# Patient Record
Sex: Male | Born: 1945 | Race: White | Hispanic: No | Marital: Married | Smoking: Former smoker
Health system: Southern US, Community
[De-identification: ages and names within clinical notes are randomized; demographics above are authoritative.]

## PROBLEM LIST (undated history)

## (undated) DIAGNOSIS — J849 Interstitial pulmonary disease, unspecified: Secondary | ICD-10-CM

## (undated) DIAGNOSIS — H269 Unspecified cataract: Secondary | ICD-10-CM

## (undated) DIAGNOSIS — J439 Emphysema, unspecified: Secondary | ICD-10-CM

## (undated) DIAGNOSIS — K219 Gastro-esophageal reflux disease without esophagitis: Secondary | ICD-10-CM

## (undated) DIAGNOSIS — I739 Peripheral vascular disease, unspecified: Secondary | ICD-10-CM

## (undated) DIAGNOSIS — G459 Transient cerebral ischemic attack, unspecified: Secondary | ICD-10-CM

## (undated) DIAGNOSIS — J449 Chronic obstructive pulmonary disease, unspecified: Secondary | ICD-10-CM

## (undated) DIAGNOSIS — M519 Unspecified thoracic, thoracolumbar and lumbosacral intervertebral disc disorder: Secondary | ICD-10-CM

## (undated) DIAGNOSIS — I779 Disorder of arteries and arterioles, unspecified: Secondary | ICD-10-CM

## (undated) DIAGNOSIS — K859 Acute pancreatitis without necrosis or infection, unspecified: Secondary | ICD-10-CM

## (undated) DIAGNOSIS — T7840XA Allergy, unspecified, initial encounter: Secondary | ICD-10-CM

## (undated) DIAGNOSIS — K259 Gastric ulcer, unspecified as acute or chronic, without hemorrhage or perforation: Secondary | ICD-10-CM

## (undated) DIAGNOSIS — G4730 Sleep apnea, unspecified: Secondary | ICD-10-CM

## (undated) DIAGNOSIS — I10 Essential (primary) hypertension: Secondary | ICD-10-CM

## (undated) DIAGNOSIS — M1990 Unspecified osteoarthritis, unspecified site: Secondary | ICD-10-CM

## (undated) HISTORY — DX: Gastric ulcer, unspecified as acute or chronic, without hemorrhage or perforation: K25.9

## (undated) HISTORY — PX: STOMACH SURGERY: SHX791

## (undated) HISTORY — DX: Allergy, unspecified, initial encounter: T78.40XA

## (undated) HISTORY — PX: NECK SURGERY: SHX720

## (undated) HISTORY — DX: Emphysema, unspecified: J43.9

## (undated) HISTORY — DX: Sleep apnea, unspecified: G47.30

## (undated) HISTORY — PX: UPPER GI ENDOSCOPY: SHX6162

## (undated) HISTORY — DX: Unspecified osteoarthritis, unspecified site: M19.90

## (undated) HISTORY — PX: EYE SURGERY: SHX253

## (undated) HISTORY — DX: Chronic obstructive pulmonary disease, unspecified: J44.9

## (undated) HISTORY — DX: Unspecified thoracic, thoracolumbar and lumbosacral intervertebral disc disorder: M51.9

## (undated) HISTORY — DX: Unspecified cataract: H26.9

## (undated) HISTORY — DX: Gastro-esophageal reflux disease without esophagitis: K21.9

## (undated) HISTORY — PX: SPINE SURGERY: SHX786

## (undated) HISTORY — DX: Interstitial pulmonary disease, unspecified: J84.9

## (undated) HISTORY — PX: CHOLECYSTECTOMY: SHX55

---

## 2003-10-20 HISTORY — PX: COLONOSCOPY: SHX174

## 2006-10-19 HISTORY — PX: OTHER SURGICAL HISTORY: SHX169

## 2007-08-09 ENCOUNTER — Ambulatory Visit: Payer: Self-pay | Admitting: Internal Medicine

## 2007-08-11 ENCOUNTER — Emergency Department: Payer: Self-pay | Admitting: Emergency Medicine

## 2007-08-11 ENCOUNTER — Other Ambulatory Visit: Payer: Self-pay

## 2007-12-15 ENCOUNTER — Ambulatory Visit: Payer: Self-pay | Admitting: Internal Medicine

## 2008-09-04 ENCOUNTER — Ambulatory Visit: Payer: Self-pay | Admitting: Internal Medicine

## 2008-09-09 ENCOUNTER — Emergency Department: Payer: Self-pay | Admitting: Emergency Medicine

## 2008-09-19 ENCOUNTER — Ambulatory Visit: Payer: Self-pay

## 2008-09-25 ENCOUNTER — Inpatient Hospital Stay: Payer: Self-pay | Admitting: Vascular Surgery

## 2008-11-24 ENCOUNTER — Emergency Department: Payer: Self-pay | Admitting: Emergency Medicine

## 2009-10-19 DIAGNOSIS — M519 Unspecified thoracic, thoracolumbar and lumbosacral intervertebral disc disorder: Secondary | ICD-10-CM

## 2009-10-19 HISTORY — PX: BACK SURGERY: SHX140

## 2009-10-19 HISTORY — DX: Unspecified thoracic, thoracolumbar and lumbosacral intervertebral disc disorder: M51.9

## 2010-01-02 ENCOUNTER — Ambulatory Visit: Payer: Self-pay

## 2010-01-08 ENCOUNTER — Ambulatory Visit: Payer: Self-pay | Admitting: Unknown Physician Specialty

## 2010-01-09 ENCOUNTER — Ambulatory Visit: Payer: Self-pay | Admitting: Unknown Physician Specialty

## 2011-05-09 ENCOUNTER — Inpatient Hospital Stay: Payer: Self-pay | Admitting: Internal Medicine

## 2011-05-20 ENCOUNTER — Ambulatory Visit: Payer: Self-pay

## 2011-05-21 ENCOUNTER — Ambulatory Visit: Payer: Self-pay | Admitting: Gastroenterology

## 2011-05-25 ENCOUNTER — Telehealth: Payer: Self-pay

## 2011-05-25 DIAGNOSIS — R933 Abnormal findings on diagnostic imaging of other parts of digestive tract: Secondary | ICD-10-CM

## 2011-05-25 NOTE — Telephone Encounter (Signed)
Pt scheduled for EUS need to review meds and instruct pt Left message on machine to call back

## 2011-05-26 ENCOUNTER — Telehealth: Payer: Self-pay | Admitting: *Deleted

## 2011-05-26 NOTE — Telephone Encounter (Signed)
Notified pt we had to r/s his procedure at [facility] from 05/28/11 to 06/11/11 at 1pm. I will have Patty call him with further instructions.

## 2011-05-28 ENCOUNTER — Encounter: Payer: Self-pay | Admitting: Gastroenterology

## 2011-05-28 ENCOUNTER — Other Ambulatory Visit: Payer: Self-pay | Admitting: Gastroenterology

## 2011-05-28 DIAGNOSIS — K859 Acute pancreatitis without necrosis or infection, unspecified: Secondary | ICD-10-CM

## 2011-05-28 NOTE — Telephone Encounter (Addendum)
PATTY, appt has been made. You need to call him for instructions.

## 2011-06-01 NOTE — Telephone Encounter (Signed)
Pt is on Plavix will he need to hold?

## 2011-06-01 NOTE — Telephone Encounter (Signed)
Letter was resent to Dr Dewaine Oats

## 2011-06-01 NOTE — Telephone Encounter (Signed)
Yes, should hold coumadin (please contact his PCP to make sure it it safe for 5 days prior to EUS)

## 2011-06-01 NOTE — Telephone Encounter (Signed)
Letter sent to Dr Mechele Collin regarding plavix

## 2011-06-02 NOTE — Telephone Encounter (Signed)
I spoke with Amy at Dr Audley Hose office and she will have a response faxed to [place]

## 2011-06-03 NOTE — Telephone Encounter (Signed)
Response received ok to hold coumadin 5 days prior pt aware

## 2011-06-11 ENCOUNTER — Encounter: Payer: Self-pay | Admitting: Gastroenterology

## 2011-06-11 ENCOUNTER — Ambulatory Visit: Payer: Self-pay

## 2011-06-26 ENCOUNTER — Encounter: Payer: Self-pay | Admitting: Gastroenterology

## 2011-07-24 ENCOUNTER — Ambulatory Visit: Payer: Self-pay | Admitting: Surgery

## 2011-07-29 ENCOUNTER — Ambulatory Visit: Payer: Self-pay | Admitting: Surgery

## 2011-08-03 ENCOUNTER — Inpatient Hospital Stay: Payer: Self-pay | Admitting: Surgery

## 2012-06-29 ENCOUNTER — Ambulatory Visit: Payer: Self-pay | Admitting: Gastroenterology

## 2012-07-06 ENCOUNTER — Ambulatory Visit: Payer: Self-pay | Admitting: Gastroenterology

## 2012-09-08 ENCOUNTER — Ambulatory Visit: Payer: Self-pay | Admitting: Surgery

## 2013-07-25 ENCOUNTER — Ambulatory Visit: Payer: Self-pay | Admitting: Internal Medicine

## 2014-04-12 ENCOUNTER — Ambulatory Visit: Payer: Self-pay | Admitting: Gastroenterology

## 2014-07-11 ENCOUNTER — Encounter: Payer: Self-pay | Admitting: General Surgery

## 2014-07-24 ENCOUNTER — Ambulatory Visit (INDEPENDENT_AMBULATORY_CARE_PROVIDER_SITE_OTHER): Payer: Medicare Other | Admitting: General Surgery

## 2014-07-24 ENCOUNTER — Encounter: Payer: Self-pay | Admitting: General Surgery

## 2014-07-24 VITALS — BP 152/82 | HR 74 | Resp 14 | Ht 71.0 in | Wt 200.0 lb

## 2014-07-24 DIAGNOSIS — Z1211 Encounter for screening for malignant neoplasm of colon: Secondary | ICD-10-CM

## 2014-07-24 NOTE — Patient Instructions (Addendum)
Colonoscopy  A colonoscopy is an exam to look at the entire large intestine (colon). This exam can help find problems such as tumors, polyps, inflammation, and areas of bleeding. The exam takes about 1 hour.   LET YOUR HEALTH CARE PROVIDER KNOW ABOUT:   · Any allergies you have.  · All medicines you are taking, including vitamins, herbs, eye drops, creams, and over-the-counter medicines.  · Previous problems you or members of your family have had with the use of anesthetics.  · Any blood disorders you have.  · Previous surgeries you have had.  · Medical conditions you have.  RISKS AND COMPLICATIONS   Generally, this is a safe procedure. However, as with any procedure, complications can occur. Possible complications include:  · Bleeding.  · Tearing or rupture of the colon wall.  · Reaction to medicines given during the exam.  · Infection (rare).  BEFORE THE PROCEDURE   · Ask your health care provider about changing or stopping your regular medicines.  · You may be prescribed an oral bowel prep. This involves drinking a large amount of medicated liquid, starting the day before your procedure. The liquid will cause you to have multiple loose stools until your stool is almost clear or light green. This cleans out your colon in preparation for the procedure.  · Do not eat or drink anything else once you have started the bowel prep, unless your health care provider tells you it is safe to do so.  · Arrange for someone to drive you home after the procedure.  PROCEDURE   · You will be given medicine to help you relax (sedative).  · You will lie on your side with your knees bent.  · A long, flexible tube with a light and camera on the end (colonoscope) will be inserted through the rectum and into the colon. The camera sends video back to a computer screen as it moves through the colon. The colonoscope also releases carbon dioxide gas to inflate the colon. This helps your health care provider see the area better.  · During  the exam, your health care provider may take a small tissue sample (biopsy) to be examined under a microscope if any abnormalities are found.  · The exam is finished when the entire colon has been viewed.  AFTER THE PROCEDURE   · Do not drive for 24 hours after the exam.  · You may have a small amount of blood in your stool.  · You may pass moderate amounts of gas and have mild abdominal cramping or bloating. This is caused by the gas used to inflate your colon during the exam.  · Ask when your test results will be ready and how you will get your results. Make sure you get your test results.  Document Released: 10/02/2000 Document Revised: 07/26/2013 Document Reviewed: 06/12/2013  ExitCare® Patient Information ©2015 [employer]. This information is not intended to replace advice given to you by your health care provider. Make sure you discuss any questions you have with your health care provider.

## 2014-07-24 NOTE — Progress Notes (Signed)
Patient ID: Ralph Melendez, male   DOB: December 22, 1945, 68 y.o.   MRN: [id number]  Chief Complaint  Patient presents with  . Other    screening colonoscopy    HPI Ralph CoryCarl L Razon is a 68 y.o. male here today for a evaluation of a screening colonoscopy. Patient last colonoscopy was done in 2005. He states he is having problem with his food was not leaving his stomach. He is undergoing evaluation at [facility] [place]. He reports recent upper endoscopy showed her large amount retained food, and for that reason he's been on a liquid diet. Patient has lost 35 in two week because of a liquid diet. Followup endoscopy is scheduled for the near future.  Patient's weight is in 2012 when he had a revision of his gastrojejunostomy by Renda RollsWilton Smith, M.D. was 221 pounds. HPI  Past Medical History  Diagnosis Date  . GERD (gastroesophageal reflux disease)   . Ruptured disk 2011  . Stomach ulcer     Past Surgical History  Procedure Laterality Date  . Colonoscopy  2005  . Carotid  2008    cleaning  . Back surgery  2011  . Stomach surgery  [id number],2012    Family History  Problem Relation Age of Onset  . Breast cancer Mother     Social History History  Substance Use Topics  . Smoking status: Current Every Day Smoker -- 1.00 packs/day for 56 years    Types: Cigarettes  . Smokeless tobacco: Not on file  . Alcohol Use: No    Allergies  Allergen Reactions  . Contrast Media [Iodinated Diagnostic Agents] Shortness Of Breath    Current Outpatient Prescriptions  Medication Sig Dispense Refill  . amLODipine (NORVASC) 5 MG tablet Take 5 mg by mouth daily.       . clopidogrel (PLAVIX) 75 MG tablet Take 75 mg by mouth once.       Marland Kitchen. HYDROcodone-acetaminophen (NORCO/VICODIN) 5-325 MG per tablet Take 1 tablet by mouth every 4 (four) hours as needed.        No current facility-administered medications for this visit.    Review of Systems Review of Systems  Constitutional: Negative.    Respiratory: Negative.   Cardiovascular: Negative.     Blood pressure 152/82, pulse 74, resp. rate 14, height 5\' 11"  (1.803 m), weight 200 lb (90.719 kg).  Physical Exam Physical Exam  Constitutional: He is oriented to person, place, and time. He appears well-developed and well-nourished.  Cardiovascular: Normal rate, regular rhythm and normal heart sounds.   Pulmonary/Chest: Effort normal and breath sounds normal.  Abdominal: Soft. Normal appearance and bowel sounds are normal. There is no tenderness. No hernia.  Neurological: He is alert and oriented to person, place, and time.  Skin: Skin is warm and dry.    Data Reviewed Colonoscopy completed 06/12/2004 was notable for diverticulosis in the sigmoid colon.  Assessment    Gastric outlet obstruction by history.  Candidate for screening colonoscopy.    Plan    The patient will discuss with his physicians at [facility] whether he should have a preprocedure colonoscopy (I anticipate he'll be undergoing revision of his gastric outlet later this year). This can be to be completed locally or at the time of a followup [place].  Patient will notify the office and we can be of any assistance.       PCP: Hart Rochesterate, Denny C   Byrnett, Jeffrey W 07/25/2014, 8:43 PM

## 2014-07-25 DIAGNOSIS — Z1211 Encounter for screening for malignant neoplasm of colon: Secondary | ICD-10-CM | POA: Insufficient documentation

## 2014-10-08 IMAGING — RF DG UGI W/ SMALL BOWEL
15 of 23 series · 15 of 23 positions shown · non-contrast
Comparison: none

REASON FOR EXAM: reflux  post surg
COMMENTS:

[Series 22: fluoro_barium 2fps_bw · 0.18mm/px · 1 of 1 slices shown (1 of 15)]
[im 1/1]
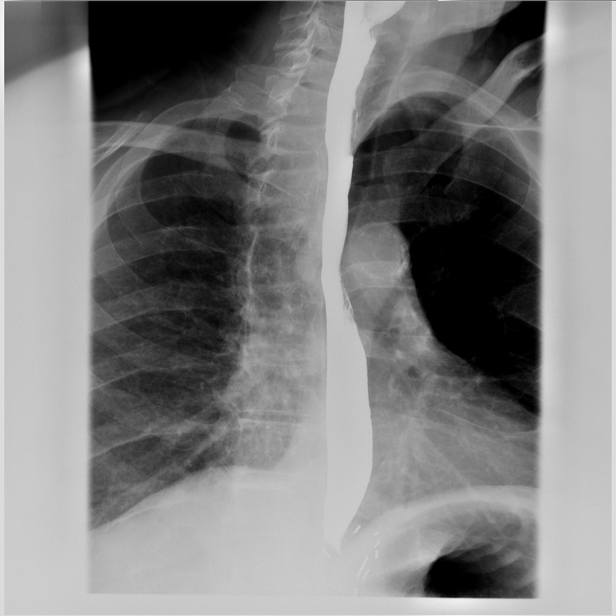

[Series 24: fluoro_barium 2fps_bw · 0.18mm/px · 1 of 1 slices shown (2 of 15)]
[im 1/1]
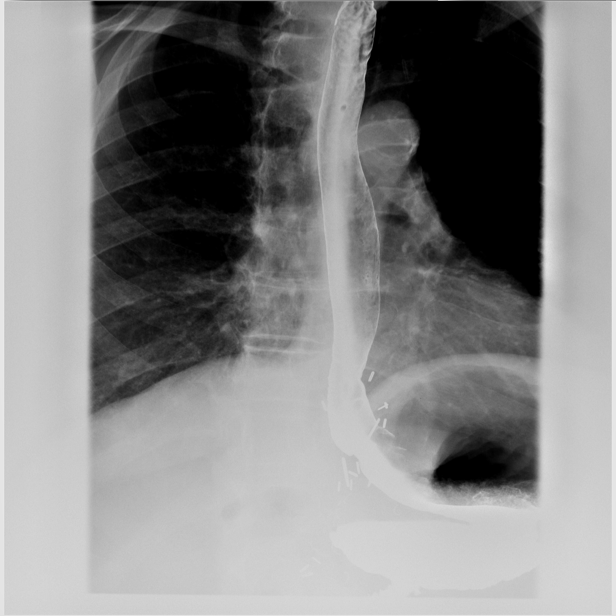

[Series 25: fluoro_barium 2fps_bw · 0.18mm/px · 1 of 1 slices shown (3 of 15)]
[im 1/1]
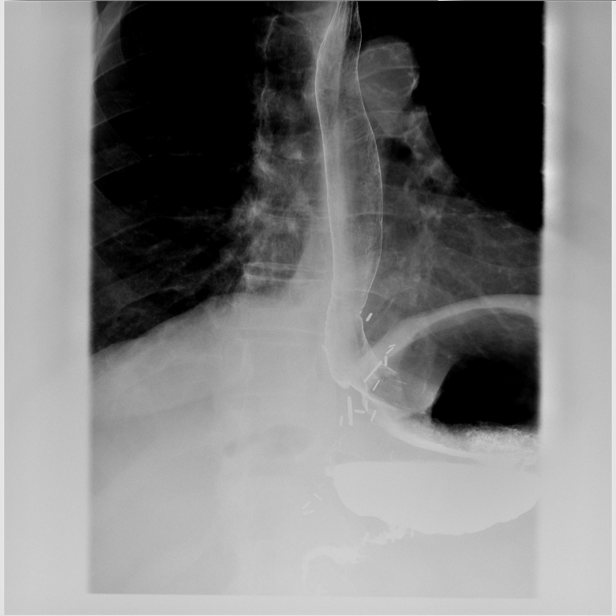

[Series 27: fluoro_barium 2fps_bw · 0.19mm/px · 1 of 1 slices shown (4 of 15)]
[im 1/1]
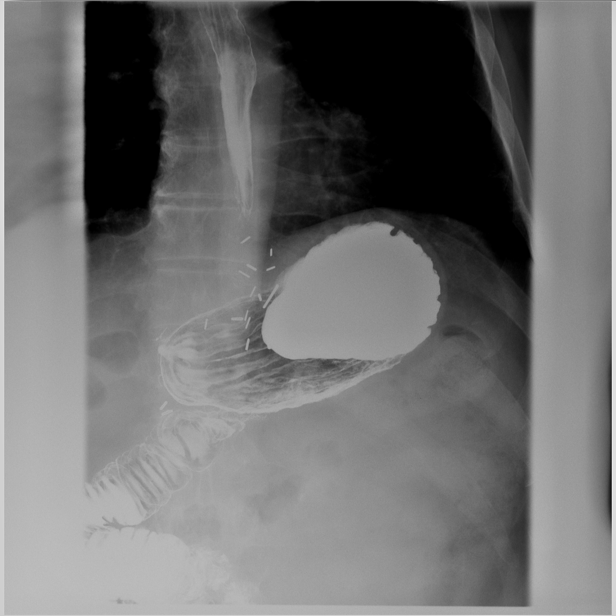

[Series 28: fluoro_barium 2fps_bw · 0.19mm/px · 1 of 1 slices shown (5 of 15)]
[im 1/1]
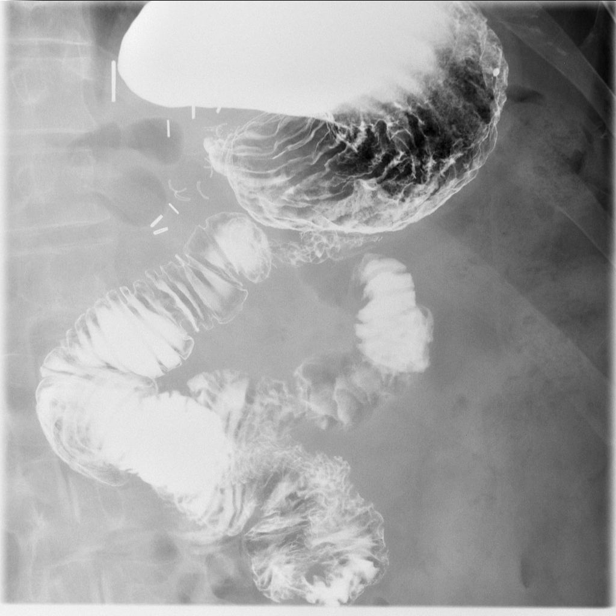

[Series 30: fluoro_barium 2fps_bw · 0.19mm/px · 1 of 1 slices shown (6 of 15)]
[im 1/1]
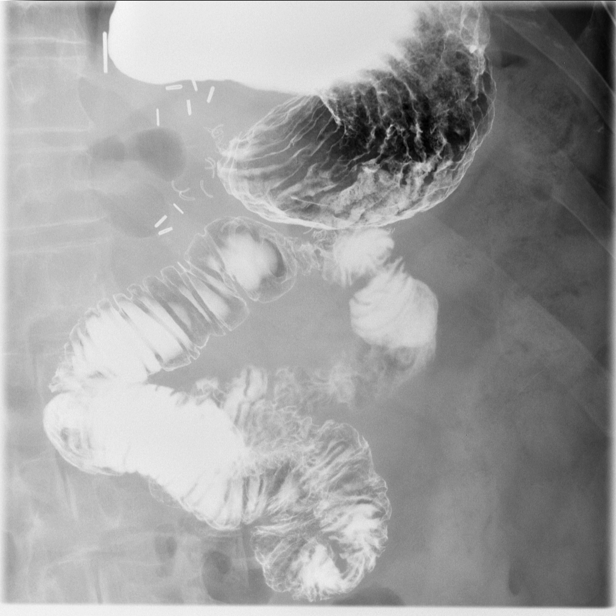

[Series 31: fluoro_barium 2fps_bw · 0.19mm/px · 1 of 1 slices shown (7 of 15)]
[im 1/1]
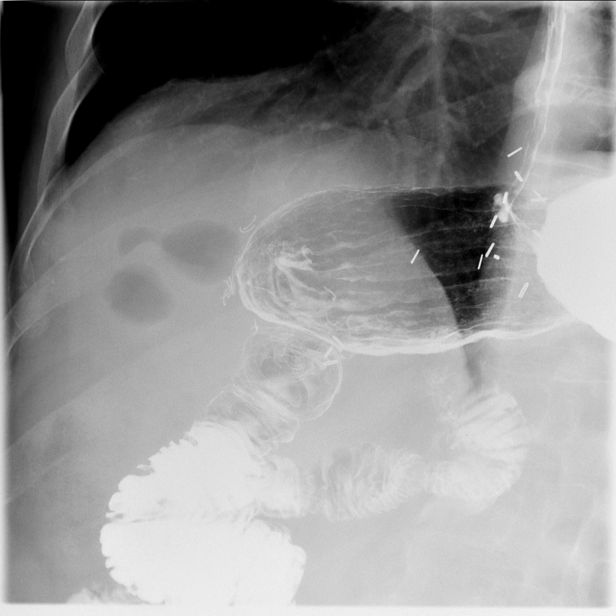

[Series 33: fluoro_barium 2fps_bw · 0.19mm/px · 1 of 1 slices shown (8 of 15)]
[im 1/1]
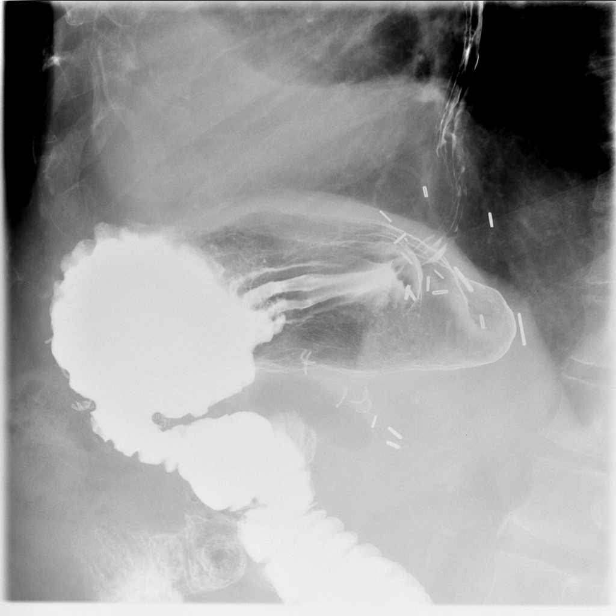

[Series 35: fluoro_barium 2fps_bw · 0.19mm/px · 1 of 1 slices shown (9 of 15)]
[im 1/1]
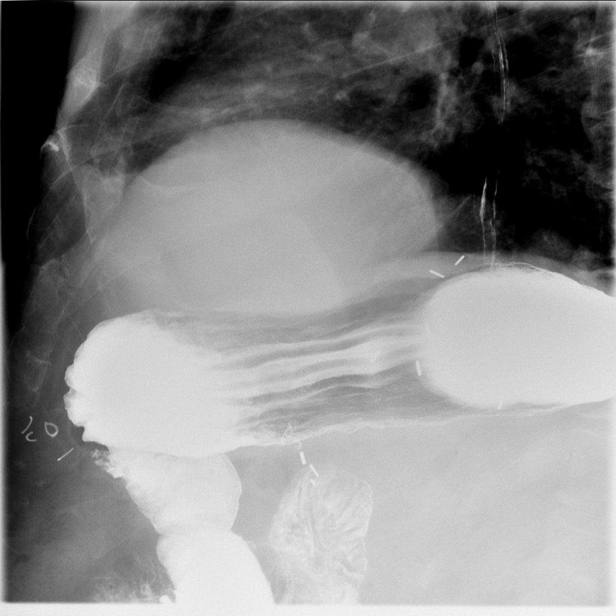

[Series 36: fluoro_barium 2fps_bw · 0.19mm/px · 1 of 1 slices shown (10 of 15)]
[im 1/1]
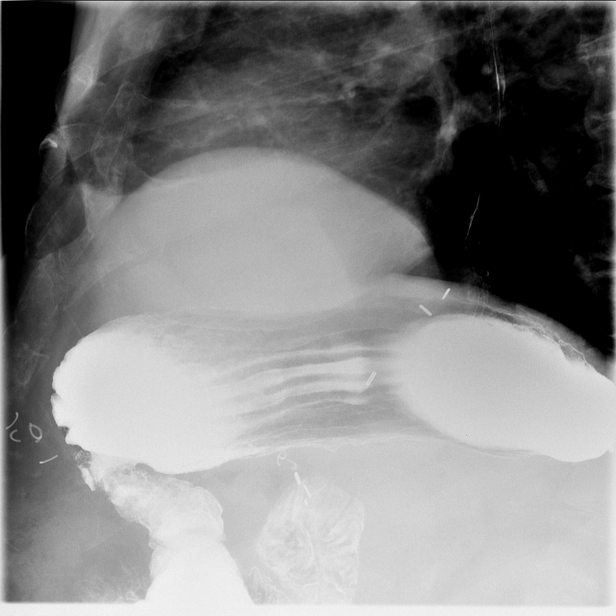

[Series 38: fluoro_barium 2fps_bw · 0.19mm/px · 1 of 1 slices shown (11 of 15)]
[im 1/1]
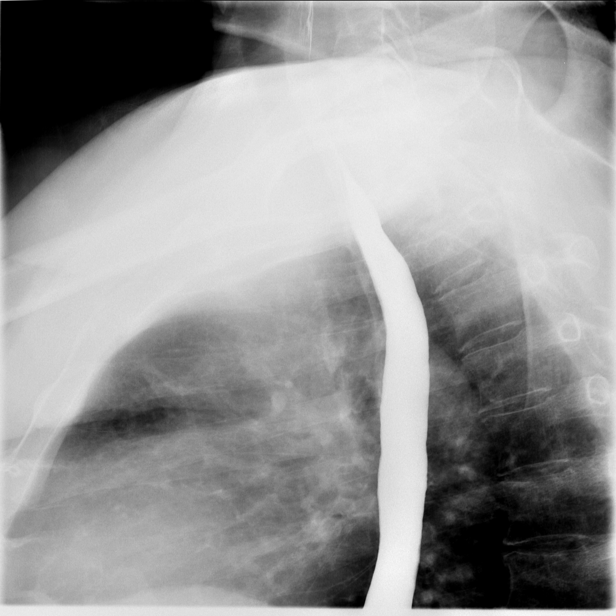

[Series 39: fluoro_barium 2fps_bw · 0.19mm/px · 1 of 1 slices shown (12 of 15)]
[im 1/1]
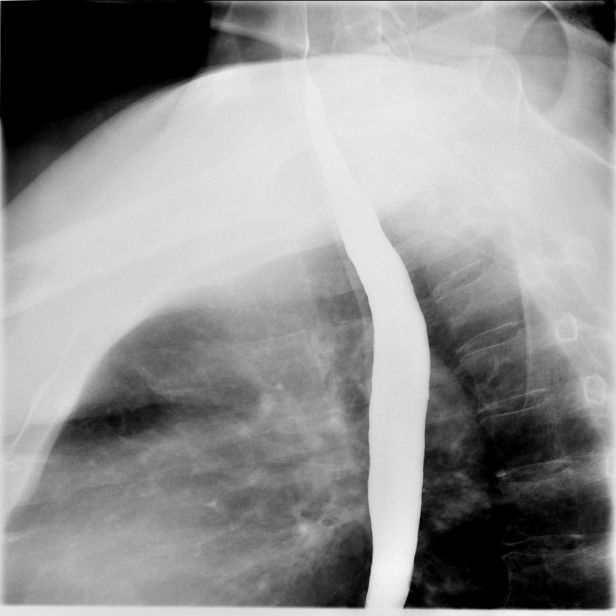

[Series 41: fluoro_barium 2fps_bw · 0.19mm/px · 1 of 1 slices shown (13 of 15)]
[im 1/1]
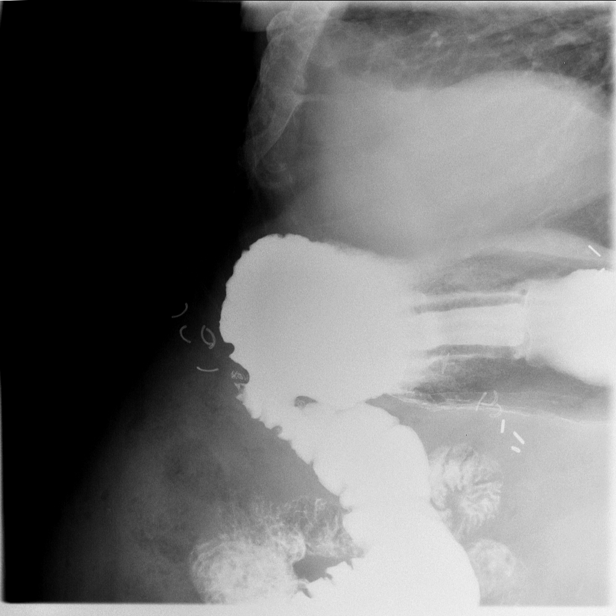

[Series 42: fluoro_barium 2fps_bw · 0.20mm/px · 1 of 1 slices shown (14 of 15)]
[im 1/1]
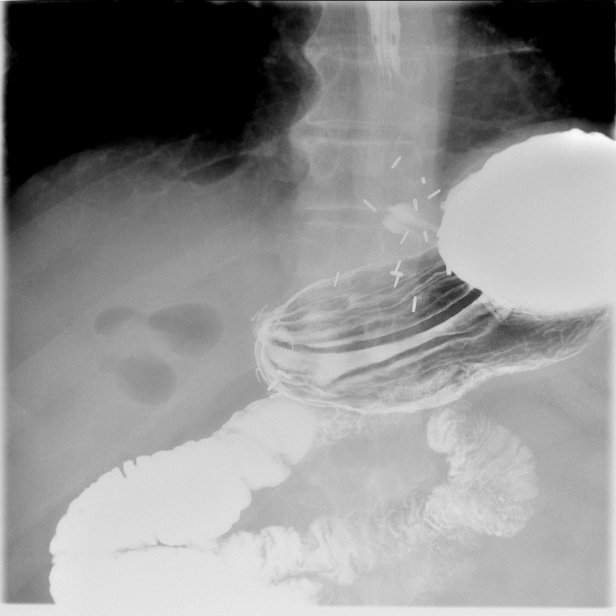

[Series 45: fluoro_barium 2fps_bw · 0.19mm/px · 1 of 1 slices shown (15 of 15)]
[im 1/1]
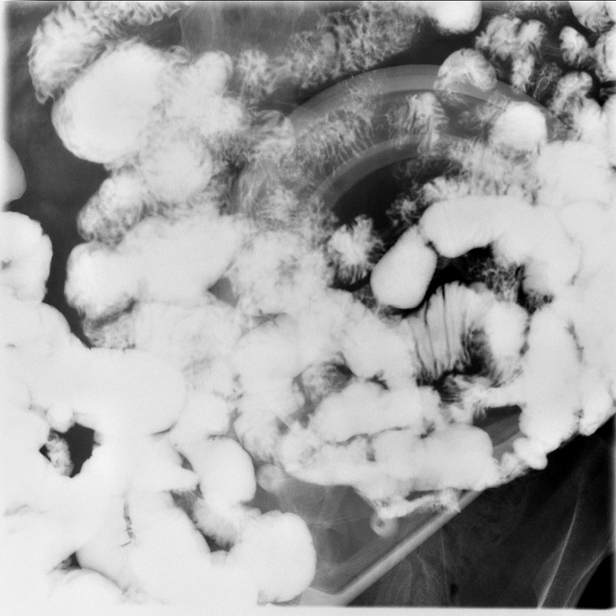

[15 of 23 positions shown; findings below may reference images not displayed]

PROCEDURE:     FL  - FL UPPER GI WITH SMALL BOWEL  - September 08, 2012  [DATE]

RESULT:     Standard air contrast upper GI obtained. Patient has had a prior
gastroenterostomy. Theostomy site is widely patent. Esophagus and stomach
and proximal bowel demonstrate no significant abnormality. Small hiatal
hernia is present. No significant reflux noted. Small bowel is normal.
Terminal ileum is normal.
IMPRESSION: Prior gastroenterostomy. Ostomy site is widely patent. No
focal esophageal, gastric, or small bowel abnormalities identified.

## 2014-10-10 ENCOUNTER — Ambulatory Visit (INDEPENDENT_AMBULATORY_CARE_PROVIDER_SITE_OTHER): Payer: Medicare Other | Admitting: General Surgery

## 2014-10-10 ENCOUNTER — Encounter: Payer: Self-pay | Admitting: General Surgery

## 2014-10-10 VITALS — BP 130/70 | HR 66 | Resp 12 | Ht 70.0 in | Wt 197.0 lb

## 2014-10-10 DIAGNOSIS — Z1211 Encounter for screening for malignant neoplasm of colon: Secondary | ICD-10-CM | POA: Diagnosis not present

## 2014-10-10 MED ORDER — POLYETHYLENE GLYCOL 3350 17 GM/SCOOP PO POWD: ORAL | Status: DC

## 2014-10-10 NOTE — Patient Instructions (Addendum)
Colonoscopy A colonoscopy is an exam to look at the entire large intestine (colon). This exam can help find problems such as tumors, polyps, inflammation, and areas of bleeding. The exam takes about 1 hour.  LET [facility] PROVIDER KNOW ABOUT:   Any allergies you have.  All medicines you are taking, including vitamins, herbs, eye drops, creams, and over-the-counter medicines.  Previous problems you or members of your family have had with the use of anesthetics.  Any blood disorders you have.  Previous surgeries you have had.  Medical conditions you have. RISKS AND COMPLICATIONS  Generally, this is a safe procedure. However, as with any procedure, complications can occur. Possible complications include:  Bleeding.  Tearing or rupture of the colon wall.  Reaction to medicines given during the exam.  Infection (rare). BEFORE THE PROCEDURE   Ask your health care provider about changing or stopping your regular medicines.  You may be prescribed an oral bowel prep. This involves drinking a large amount of medicated liquid, starting the day before your procedure. The liquid will cause you to have multiple loose stools until your stool is almost clear or light green. This cleans out your colon in preparation for the procedure.  Do not eat or drink anything else once you have started the bowel prep, unless your health care provider tells you it is safe to do so.  Arrange for someone to drive you home after the procedure. PROCEDURE   You will be given medicine to help you relax (sedative).  You will lie on your side with your knees bent.  A long, flexible tube with a light and camera on the end (colonoscope) will be inserted through the rectum and into the colon. The camera sends video back to a computer screen as it moves through the colon. The colonoscope also releases carbon dioxide gas to inflate the colon. This helps your health care provider see the area better.  During  the exam, your health care provider may take a small tissue sample (biopsy) to be examined under a microscope if any abnormalities are found.  The exam is finished when the entire colon has been viewed. AFTER THE PROCEDURE   Do not drive for 24 hours after the exam.  You may have a small amount of blood in your stool.  You may pass moderate amounts of gas and have mild abdominal cramping or bloating. This is caused by the gas used to inflate your colon during the exam.  Ask when your test results will be ready and how you will get your results. Make sure you get your test results. Document Released: 10/02/2000 Document Revised: 07/26/2013 Document Reviewed: 06/12/2013 [facility] Patient Information 2015 [employer] [place]. This information is not intended to replace advice given to you by your health care provider. Make sure you discuss any questions you have with your health care provider.  Patient has been scheduled for a colon[facility]. This patient will discontinue Plavix one week prior to procedure and start an 81 mg aspirin once Plavix is stopped.

## 2014-10-10 NOTE — Progress Notes (Signed)
Patient ID: Ralph Melendez, male   DOB: Oct 18, 1946, 68 y.o.   MRN: [id number]  Chief Complaint  Patient presents with  . Colonoscopy    HPI Ralph CoryCarl L Boesen is a 68 y.o. male.  Here today to discuss having a colonoscopy. His last exam was in 2005 at which time a few [place] diverticuli were identified. He plans [facility]. The patient reports history of gastric ulcers in the past. 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 He's had persistent difficulty with emptying of solids and takedown of the Roux-en-Y loop is planned for February, 2016. He reports no difficulty with liquids. Bowels move daily or every other day, no blood. Stress test planned for tomorrow.   HPI  Past Medical History  Diagnosis Date  . GERD (gastroesophageal reflux disease)   . Ruptured disk 2011  . Stomach ulcer     Past Surgical History  Procedure Laterality Date  . Colonoscopy  2005  . Carotid  2008    cleaning  . Back surgery  2011  . Stomach surgery  [id number]'S,2012  . Upper gi endoscopy      x 3    Family History  Problem Relation Age of Onset  . Breast cancer Mother     Social History History  Substance Use Topics  . Smoking status: Former Smoker -- 1.00 packs/day for 56 years    Types: Cigarettes    Quit date: 10/07/2014  . Smokeless tobacco: Not on file  . Alcohol Use: No    Allergies  Allergen Reactions  . Contrast Media [Iodinated Diagnostic Agents] Shortness Of Breath  . Other Anaphylaxis    Other: Shrimp    Current Outpatient Prescriptions  Medication Sig Dispense Refill  . amLODipine (NORVASC) 5 MG tablet Take 5 mg by mouth daily.     . clopidogrel (PLAVIX) 75 MG tablet Take 75 mg by mouth once.     Marland Kitchen. HYDROcodone-acetaminophen (NORCO/VICODIN) 5-325 MG per tablet Take 1 tablet by mouth every 4 (four) hours as needed.     .  polyethylene glycol powder (GLYCOLAX/MIRALAX) powder 255 grams one bottle for colonoscopy prep 255 g 0   No current facility-administered medications for this visit.    Review of Systems Review of Systems  Constitutional: Negative.   Cardiovascular: Negative.     Blood pressure 130/70, pulse 66, resp. rate 12, height 5\' 10"  (1.778 m), weight 197 lb (89.359 kg).  Physical Exam Physical Exam  Constitutional: He is oriented to person, place, and time. He appears well-developed and well-nourished.  Neck: Neck supple. No thyromegaly present.  Cardiovascular: Normal rate, regular rhythm and normal heart sounds.   No murmur heard. Pulmonary/Chest: Effort normal and breath sounds normal.  Lymphadenopathy:    He has no cervical adenopathy.  Neurological: He is alert and oriented to person, place, and time.  Skin: Skin is warm and dry.    Data Reviewed [facility].  Assessment    Candidate for screening colonoscopy.    Plan    Patient has been scheduled for a [facility]. This patient will discontinue Plavix one week prior to procedure and start an 81 mg aspirin once Plavix is stopped.      PCP/Ref MD:  Orvan Seenate, Denny   Byrnett, Jeffrey W 10/12/2014, 3:38 PM

## 2014-10-12 ENCOUNTER — Other Ambulatory Visit: Payer: Self-pay | Admitting: General Surgery

## 2014-10-12 DIAGNOSIS — Z1211 Encounter for screening for malignant neoplasm of colon: Secondary | ICD-10-CM

## 2014-11-06 ENCOUNTER — Ambulatory Visit: Payer: Self-pay | Admitting: General Surgery

## 2014-11-06 DIAGNOSIS — Z1211 Encounter for screening for malignant neoplasm of colon: Secondary | ICD-10-CM

## 2014-11-08 ENCOUNTER — Encounter: Payer: Self-pay | Admitting: General Surgery

## 2015-09-16 ENCOUNTER — Inpatient Hospital Stay
Admission: EM | Admit: 2015-09-16 | Discharge: 2015-09-19 | DRG: 440 | Disposition: A | Discharge status: Home / Self Care | Payer: Medicare Other | Attending: Internal Medicine | Admitting: Internal Medicine

## 2015-09-16 ENCOUNTER — Encounter: Payer: Self-pay | Admitting: Emergency Medicine

## 2015-09-16 ENCOUNTER — Emergency Department: Payer: Medicare Other

## 2015-09-16 DIAGNOSIS — K859 Acute pancreatitis without necrosis or infection, unspecified: Secondary | ICD-10-CM | POA: Diagnosis not present

## 2015-09-16 DIAGNOSIS — R1013 Epigastric pain: Secondary | ICD-10-CM

## 2015-09-16 DIAGNOSIS — I779 Disorder of arteries and arterioles, unspecified: Secondary | ICD-10-CM | POA: Diagnosis present

## 2015-09-16 DIAGNOSIS — Z91013 Allergy to seafood: Secondary | ICD-10-CM

## 2015-09-16 DIAGNOSIS — K219 Gastro-esophageal reflux disease without esophagitis: Secondary | ICD-10-CM | POA: Diagnosis present

## 2015-09-16 DIAGNOSIS — K760 Fatty (change of) liver, not elsewhere classified: Secondary | ICD-10-CM | POA: Diagnosis present

## 2015-09-16 DIAGNOSIS — I739 Peripheral vascular disease, unspecified: Secondary | ICD-10-CM

## 2015-09-16 DIAGNOSIS — I10 Essential (primary) hypertension: Secondary | ICD-10-CM | POA: Diagnosis present

## 2015-09-16 DIAGNOSIS — Z8673 Personal history of transient ischemic attack (TIA), and cerebral infarction without residual deficits: Secondary | ICD-10-CM

## 2015-09-16 DIAGNOSIS — I700 Atherosclerosis of aorta: Secondary | ICD-10-CM | POA: Diagnosis present

## 2015-09-16 DIAGNOSIS — Z79899 Other long term (current) drug therapy: Secondary | ICD-10-CM | POA: Diagnosis not present

## 2015-09-16 DIAGNOSIS — R109 Unspecified abdominal pain: Secondary | ICD-10-CM

## 2015-09-16 DIAGNOSIS — K5730 Diverticulosis of large intestine without perforation or abscess without bleeding: Secondary | ICD-10-CM | POA: Diagnosis present

## 2015-09-16 DIAGNOSIS — Z7902 Long term (current) use of antithrombotics/antiplatelets: Secondary | ICD-10-CM | POA: Diagnosis not present

## 2015-09-16 DIAGNOSIS — Z87891 Personal history of nicotine dependence: Secondary | ICD-10-CM | POA: Diagnosis not present

## 2015-09-16 DIAGNOSIS — Z91041 Radiographic dye allergy status: Secondary | ICD-10-CM

## 2015-09-16 HISTORY — DX: Disorder of arteries and arterioles, unspecified: I77.9

## 2015-09-16 HISTORY — DX: Transient cerebral ischemic attack, unspecified: G45.9

## 2015-09-16 HISTORY — DX: Acute pancreatitis without necrosis or infection, unspecified: K85.90

## 2015-09-16 HISTORY — DX: Essential (primary) hypertension: I10

## 2015-09-16 HISTORY — DX: Peripheral vascular disease, unspecified: I73.9

## 2015-09-16 LAB — URINALYSIS COMPLETE WITH MICROSCOPIC (ARMC ONLY)
BACTERIA UA: NONE SEEN
BILIRUBIN URINE: NEGATIVE
GLUCOSE, UA: NEGATIVE mg/dL
HGB URINE DIPSTICK: NEGATIVE
LEUKOCYTES UA: NEGATIVE
NITRITE: NEGATIVE
PH: 5 (ref 5.0–8.0)
Protein, ur: NEGATIVE mg/dL
SPECIFIC GRAVITY, URINE: 1.018 (ref 1.005–1.030)

## 2015-09-16 LAB — CBC
HCT: 45.6 % (ref 40.0–52.0)
Hemoglobin: 14.9 g/dL (ref 13.0–18.0)
MCH: 30.1 pg (ref 26.0–34.0)
MCHC: 32.6 g/dL (ref 32.0–36.0)
MCV: 92.1 fL (ref 80.0–100.0)
PLATELETS: 238 10*3/uL (ref 150–440)
RBC: 4.96 MIL/uL (ref 4.40–5.90)
RDW: 13.8 % (ref 11.5–14.5)
WBC: 12.6 10*3/uL — AB (ref 3.8–10.6)

## 2015-09-16 LAB — LIPASE, BLOOD: Lipase: 1382 U/L — ABNORMAL HIGH (ref 11–51)

## 2015-09-16 LAB — COMPREHENSIVE METABOLIC PANEL
ALBUMIN: 4.4 g/dL (ref 3.5–5.0)
ALT: 60 U/L (ref 17–63)
AST: 61 U/L — AB (ref 15–41)
Alkaline Phosphatase: 58 U/L (ref 38–126)
Anion gap: 7 (ref 5–15)
BUN: 21 mg/dL — AB (ref 6–20)
CHLORIDE: 102 mmol/L (ref 101–111)
CO2: 27 mmol/L (ref 22–32)
Calcium: 9.4 mg/dL (ref 8.9–10.3)
Creatinine, Ser: 0.94 mg/dL (ref 0.61–1.24)
GFR calc Af Amer: >60 mL/min (ref >60)
GFR calc non Af Amer: >60 mL/min (ref >60)
GLUCOSE: 135 mg/dL — AB (ref 65–99)
POTASSIUM: 3.9 mmol/L (ref 3.5–5.1)
SODIUM: 136 mmol/L (ref 135–145)
Total Bilirubin: 0.8 mg/dL (ref 0.3–1.2)
Total Protein: 8 g/dL (ref 6.5–8.1)

## 2015-09-16 MED ORDER — HYDROMORPHONE HCL 1 MG/ML IJ SOLN
INTRAMUSCULAR | Status: AC
  Administered 2015-09-16: 1 mg via INTRAVENOUS
  Filled 2015-09-16: qty 1

## 2015-09-16 MED ORDER — ONDANSETRON HCL 4 MG/2ML IJ SOLN
INTRAMUSCULAR | Status: AC
  Administered 2015-09-16: 4 mg
  Filled 2015-09-16: qty 2

## 2015-09-16 MED ORDER — ACETAMINOPHEN 650 MG RE SUPP: 650.0000 mg | Freq: Four times a day (QID) | RECTAL | Status: DC | PRN

## 2015-09-16 MED ORDER — CLOPIDOGREL BISULFATE 75 MG PO TABS
75.0000 mg | ORAL_TABLET | Freq: Every day | ORAL | Status: DC
Start: 2015-09-17 — End: 2015-09-19
  Administered 2015-09-17 – 2015-09-19 (×3): 75 mg via ORAL
  Filled 2015-09-16 (×3): qty 1

## 2015-09-16 MED ORDER — HYDROMORPHONE HCL 1 MG/ML IJ SOLN
1.0000 mg | INTRAMUSCULAR | Status: DC | PRN
  Administered 2015-09-16: 1 mg via INTRAVENOUS

## 2015-09-16 MED ORDER — SODIUM CHLORIDE 0.9 % IV SOLN
INTRAVENOUS | Status: AC
  Administered 2015-09-16: 23:00:00 via INTRAVENOUS

## 2015-09-16 MED ORDER — ACETAMINOPHEN 325 MG PO TABS
650.0000 mg | ORAL_TABLET | Freq: Four times a day (QID) | ORAL | Status: DC | PRN
  Administered 2015-09-17: 650 mg via ORAL
  Filled 2015-09-16: qty 2

## 2015-09-16 MED ORDER — BARIUM SULFATE 2.1 % PO SUSP
450.0000 mL | ORAL | Status: AC
  Administered 2015-09-16 (×2): 450 mL via ORAL

## 2015-09-16 MED ORDER — SODIUM CHLORIDE 0.9 % IJ SOLN
3.0000 mL | Freq: Two times a day (BID) | INTRAMUSCULAR | Status: DC
  Administered 2015-09-16 – 2015-09-17 (×2): 3 mL via INTRAVENOUS

## 2015-09-16 MED ORDER — ONDANSETRON HCL 4 MG PO TABS: 4.0000 mg | ORAL_TABLET | Freq: Four times a day (QID) | ORAL | Status: DC | PRN

## 2015-09-16 MED ORDER — HYDROMORPHONE HCL 1 MG/ML IJ SOLN
1.0000 mg | Freq: Once | INTRAMUSCULAR | Status: DC
  Administered 2015-09-16: 1 mg via INTRAVENOUS
  Filled 2015-09-16: qty 1

## 2015-09-16 MED ORDER — ONDANSETRON HCL 4 MG/2ML IJ SOLN: 4.0000 mg | Freq: Four times a day (QID) | INTRAMUSCULAR | Status: DC | PRN

## 2015-09-16 MED ORDER — SODIUM CHLORIDE 0.9 % IV BOLUS (SEPSIS)
1000.0000 mL | Freq: Once | INTRAVENOUS | Status: AC
  Administered 2015-09-16: 1000 mL via INTRAVENOUS
  Filled 2015-09-16: qty 1000

## 2015-09-16 MED ORDER — HYDROMORPHONE HCL 1 MG/ML IJ SOLN
0.5000 mg | INTRAMUSCULAR | Status: DC | PRN
  Administered 2015-09-16 – 2015-09-18 (×10): 0.5 mg via INTRAVENOUS
  Filled 2015-09-16 (×10): qty 1

## 2015-09-16 MED ORDER — ENOXAPARIN SODIUM 40 MG/0.4ML ~~LOC~~ SOLN
40.0000 mg | SUBCUTANEOUS | Status: DC
  Administered 2015-09-16 – 2015-09-18 (×3): 40 mg via SUBCUTANEOUS
  Filled 2015-09-16 (×3): qty 0.4

## 2015-09-16 MED ORDER — FENTANYL CITRATE (PF) 100 MCG/2ML IJ SOLN
75.0000 ug | Freq: Once | INTRAMUSCULAR | Status: AC
  Administered 2015-09-16: 75 ug via INTRAVENOUS
  Filled 2015-09-16: qty 2

## 2015-09-16 MED ORDER — PANTOPRAZOLE SODIUM 40 MG IV SOLR
40.0000 mg | INTRAVENOUS | Status: DC
  Administered 2015-09-16 – 2015-09-18 (×3): 40 mg via INTRAVENOUS
  Filled 2015-09-16 (×4): qty 40

## 2015-09-16 NOTE — H&P (Signed)
[facility] [place] [facility]   PATIENT NAME: Ralph Melendez    MR#:  [id number]  DATE OF BIRTH:  15-Nov-1945  DATE OF ADMISSION:  09/16/2015  PRIMARY CARE PHYSICIAN: Jaclyn ShaggyATE,DENNY C, MD   REQUESTING/REFERRING PHYSICIAN: Sharma CovertNorman, M.D.  CHIEF COMPLAINT:   Chief Complaint  Patient presents with  . Abdominal Pain    HISTORY OF PRESENT ILLNESS:  Ralph Melendez. Patient denies any radiation or other associated symptoms. His pain started after he ate a spaghetti and salad. Patient has had pancreatitis in the past, 3-4 years ago. In the ED today his lipase was greater than 1300. CT scan of the pelvis was showed peri-pancreatic inflammation consistent with pancreatitis. Hospitalists were called for admission  PAST MEDICAL HISTORY:   Past Medical History  Diagnosis Date  . GERD (gastroesophageal reflux disease)   . Ruptured disk 2011  . Stomach ulcer   . Pancreatitis   . HTN (hypertension)   . TIA (transient ischemic attack)   . Carotid artery disease (HCC)     PAST SURGICAL HISTORY:   Past Surgical History  Procedure Laterality Date  . Colonoscopy  2005  . Carotid  2008    cleaning  . Back surgery  2011  . Stomach surgery  [id number]'Q,[id number]'S,2012  . Upper gi endoscopy      x 3    SOCIAL HISTORY:   Social History  Substance Use Topics  . Smoking status: Former Smoker -- 1.00 packs/day for 56 years    Types: Cigarettes    Quit date: 10/07/2014  . Smokeless tobacco: Not on file  . Alcohol Use: No    FAMILY HISTORY:   Family History  Problem Relation Age of Onset  . Breast cancer Mother     DRUG ALLERGIES:   Allergies  Allergen Reactions  . Contrast Media [Iodinated Diagnostic Agents] Anaphylaxis  . Shrimp [Shellfish Allergy] Anaphylaxis    MEDICATIONS AT HOME:   Prior to Admission medications   Medication Sig Start Date End Date Taking?  Authorizing Provider  amLODipine (NORVASC) 5 MG tablet Take 5 mg by mouth daily.   Yes Historical Provider, MD  clopidogrel (PLAVIX) 75 MG tablet Take 75 mg by mouth daily.   Yes Historical Provider, MD    REVIEW OF SYSTEMS:  Review of Systems  Constitutional: Negative for fever, chills, weight loss and malaise/fatigue.  HENT: Negative for ear pain, hearing loss and tinnitus.   Eyes: Negative for blurred vision, double vision, pain and redness.  Respiratory: Negative for cough, hemoptysis and shortness of breath.   Cardiovascular: Negative for chest pain, palpitations, orthopnea and leg swelling.  Gastrointestinal: Positive for nausea and abdominal pain. Negative for vomiting, diarrhea and constipation.  Genitourinary: Negative for dysuria, frequency and hematuria.  Musculoskeletal: Negative for back pain, joint pain and neck pain.  Skin:       No acne, rash, or lesions  Neurological: Negative for dizziness, tremors, focal weakness and weakness.  Endo/Heme/Allergies: Negative for polydipsia. Does not bruise/bleed easily.  Psychiatric/Behavioral: Negative for depression. The patient is not nervous/anxious and does not have insomnia.      VITAL SIGNS:   Filed Vitals:   09/16/15 1741 09/16/15 2011  BP: 161/84 174/92  Pulse: 96 74  Temp: 97.8 F (36.6 C) 98 F (36.7 C)  TempSrc: Oral Oral  Resp: 18 20  Height: 5\' 10"  (1.778 m)  Weight: 89.359 kg (197 lb)   SpO2: 94% 99%   Wt Readings from Last 3 Encounters:  09/16/15 89.359 kg (197 lb)  10/10/14 89.359 kg (197 lb)  07/24/14 90.719 kg (200 lb)    PHYSICAL EXAMINATION:  Physical Exam  Vitals reviewed. Constitutional: He is oriented to person, place, and time. He appears well-developed and well-nourished. No distress.  HENT:  Head: Normocephalic and atraumatic.  Mouth/Throat: Oropharynx is clear and moist.  Eyes: Conjunctivae and EOM are normal. Pupils are equal, round, and reactive to light. No scleral icterus.  Neck:  Normal range of motion. Neck supple. No JVD present. No thyromegaly present.  Cardiovascular: Normal rate, regular rhythm and intact distal pulses.  Exam reveals no gallop and no friction rub.   No murmur heard. Respiratory: Effort normal and breath sounds normal. No respiratory distress. He has no wheezes. He has no rales.  GI: Soft. Bowel sounds are normal. He exhibits no distension. There is tenderness (epigastric).  Musculoskeletal: Normal range of motion. He exhibits no edema.  No arthritis, no gout  Lymphadenopathy:    He has no cervical adenopathy.  Neurological: He is alert and oriented to person, place, and time. No cranial nerve deficit.  No dysarthria, no aphasia  Skin: Skin is warm and dry. No rash noted. No erythema.  Psychiatric: He has a normal mood and affect. His behavior is normal. Judgment and thought content normal.    LABORATORY PANEL:   CBC  Recent Labs Lab 09/16/15 1818  WBC 12.6*  HGB 14.9  HCT 45.6  PLT 238   ------------------------------------------------------------------------------------------------------------------  Chemistries   Recent Labs Lab 09/16/15 1818  NA 136  K 3.9  CL 102  CO2 27  GLUCOSE 135*  BUN 21*  CREATININE 0.94  CALCIUM 9.4  AST 61*  ALT 60  ALKPHOS 58  BILITOT 0.8   ------------------------------------------------------------------------------------------------------------------  Cardiac Enzymes No results for input(s): TROPONINI in the last 168 hours. ------------------------------------------------------------------------------------------------------------------  RADIOLOGY:  Ct Abdomen Pelvis Wo Contrast  09/16/2015  CLINICAL DATA:  69 year old Melendez with acute abdominal and pelvic pain with vomiting today. EXAM: CT ABDOMEN AND PELVIS WITHOUT CONTRAST TECHNIQUE: Multidetector CT imaging of the abdomen and pelvis was performed following the standard protocol without IV contrast. COMPARISON:  05/09/2011 CT  FINDINGS: Please note that parenchymal abnormalities may be missed without intravenous contrast. Lower chest:  Mild left basilar scarring noted. Hepatobiliary: Mild hepatic steatosis again identified. No definite focal hepatic abnormalities are noted. The gallbladder is unremarkable. There is no evidence of biliary dilatation. Pancreas: Mild peripancreatic inflammation with small amount of fluid in the left anterior para renal space is compatible with pancreatitis. There is no evidence of pancreatic necrosis, hemorrhage or venous thrombosis. Spleen: Unremarkable Adrenals/Urinary Tract: The kidneys, adrenal glands and bladder are unremarkable except for an unchanged left renal cyst. Stomach/Bowel: Evidence of prior gastric and enteric surgery identified. There is no evidence of bowel obstruction or definite bowel focal bowel wall thickening. Descending and sigmoid colonic diverticulosis noted without evidence of diverticulitis. The appendix is normal. Vascular/Lymphatic: Aortic atherosclerotic calcifications noted without aneurysm. There is no evidence of enlarged lymph nodes. Reproductive: Prostate unremarkable Other: No free fluid, focal collection or pneumoperitoneum. Musculoskeletal: No acute or suspicious abnormalities. IMPRESSION: Peripancreatic inflammation and small amount of retroperitoneal fluid, compatible with acute pancreatitis. No evidence of biliary dilatation, pancreatic necrosis, hemorrhage or venous thrombosis. Mild hepatic steatosis, colonic diverticulosis and aortic atherosclerosis again identified. Electronically Signed   By: Harmon Pier M.D.   On: 09/16/2015 20:40  EKG:   Orders placed or performed in visit on 05/09/11  . EKG 12-Lead    IMPRESSION AND PLAN:  Principal Problem:   Pancreatitis, acute - nothing by mouth, IV pain control, IV antiemetics, monitor lipase, monitor for improvement. Restart by mouth intake once his pain is relatively well controlled. IV fluids for  hydration. Active Problems:   HTN (hypertension) - blood pressure goal currently, goal less than 160/100, can use IV antihypertensives if necessary.   Carotid artery disease (HCC) - patient is on Plavix as he has had prior TIAs, we will continue this medication.   GERD (gastroesophageal reflux disease) - PPI  All the records are reviewed and case discussed with ED provider. Management plans discussed with the patient and/or family.  DVT PROPHYLAXIS: SubQ lovenox  ADMISSION STATUS: Inpatient  CODE STATUS: Full  TOTAL TIME TAKING CARE OF THIS PATIENT: 45 minutes.    Saddie Sandeen FIELDING 09/16/2015, 9:08 PM  [facility]  Office  [phone]  CC: Primary care physician; Jaclyn Shaggy, MD

## 2015-09-16 NOTE — ED Provider Notes (Signed)
[facility] Emergency Department Provider Note  ____________________________________________  Time seen: Approximately 6:14 PM  I have reviewed the triage vital signs and the nursing notes.   HISTORY  Chief Complaint Abdominal Pain    HPI Ralph Melendez is a 69 y.o. male with a history of GERD, PUD,  Pancreatitis, and surgical intervention for obstruction 3 remotely presenting with acute onset of epigastric and periumbilical pain associated with nausea and vomiting. Patient states that after spaghetti and let us lunch today he had the acute onset of a sharp periumbilical and epigastric pain.It was associated with nausea and vomiting. No fever or chills. Last bowel movement was yesterday which was normal and patient is passing flatus. No urinary symptoms, testicular or scrotal pain.   Past Medical History  Diagnosis Date  . GERD (gastroesophageal reflux disease)   . Ruptured disk 2011  . Stomach ulcer     Patient Active Problem List   Diagnosis Date Noted  . Encounter for screening colonoscopy 07/25/2014    Past Surgical History  Procedure Laterality Date  . Colonoscopy  2005  . Carotid  2008    cleaning  . Back surgery  2011  . Stomach surgery  [id number]'R,[id number]'S,2012  . Upper gi endoscopy      x 3    Current Outpatient Rx  Name  Route  Sig  Dispense  Refill  . amLODipine (NORVASC) 5 MG tablet   Oral   Take 5 mg by mouth daily.          . clopidogrel (PLAVIX) 75 MG tablet   Oral   Take 75 mg by mouth once.          Marland Kitchen. HYDROcodone-acetaminophen (NORCO/VICODIN) 5-325 MG per tablet   Oral   Take 1 tablet by mouth every 4 (four) hours as needed.          . polyethylene glycol powder (GLYCOLAX/MIRALAX) powder      255 grams one bottle for colonoscopy prep   255 g   0     Allergies Contrast media and Other  Family History  Problem Relation Age of Onset  . Breast cancer Mother     Social History Social History  Substance Use Topics   . Smoking status: Former Smoker -- 1.00 packs/day for 56 years    Types: Cigarettes    Quit date: 10/07/2014  . Smokeless tobacco: None  . Alcohol Use: No    Review of Systems Constitutional: No fever/chills Eyes: No visual changes. ENT: No sore throat. Cardiovascular: Denies chest pain, palpitations. Respiratory: Denies shortness of breath.  No cough. Gastrointestinal: Positive abdominal pain.  Positive nausea and vomiting.  No diarrhea.  No constipation. Genitourinary: Negative for dysuria. No penile or scrotal or testicular pain. Musculoskeletal: Negative for back pain. Skin: Negative for rash. Neurological: Negative for headaches, focal weakness or numbness.  10-point ROS otherwise negative.  ____________________________________________   PHYSICAL EXAM:  VITAL SIGNS: ED Triage Vitals  Enc Vitals Group     BP 09/16/15 1741 161/84 mmHg     Pulse Rate 09/16/15 1741 96     Resp 09/16/15 1741 18     Temp 09/16/15 1741 97.8 F (36.6 C)     Temp Source 09/16/15 1741 Oral     SpO2 09/16/15 1741 94 %     Weight 09/16/15 1741 197 lb (89.359 kg)     Height 09/16/15 1741 5\' 10"  (1.778 m)     Head Cir --      Peak  Flow --      Pain Score 09/16/15 1741 9     Pain Loc --      Pain Edu? --      Excl. in GC? --     Constitutional: Alert and oriented and answering questions appropriately uncomfortable appearing lying in the stretcher.  Eyes: Conjunctivae are normal.  EOMI. no scleral icterus Head: Atraumatic. Nose: No congestion/rhinnorhea. Mouth/Throat: Mucous membranes are moist.  Neck: No stridor.  Supple.   Cardiovascular: Normal rate, regular rhythm. No murmurs, rubs or gallops.  Respiratory: Normal respiratory effort.  No retractions. Lungs CTAB.  No wheezes, rales or ronchi. Gastrointestinal: Soft and nondistended. Tender to palpation above the umbilicus in the epigastric region. No Murphy sign. No rebound or guarding. No peritoneal signs. Musculoskeletal: No LE  edema.  Neurologic:  Normal speech and language. No gross focal neurologic deficits are appreciated.  Skin:  Skin is warm, dry and intact. No rash noted. Psychiatric: Mood and affect are normal. Speech and behavior are normal.  Normal judgement.  ____________________________________________   LABS (all labs ordered are listed, but only abnormal results are displayed)  Labs Reviewed  CBC - Abnormal; Notable for the following:    WBC 12.6 (*)    All other components within normal limits  COMPREHENSIVE METABOLIC PANEL - Abnormal; Notable for the following:    Glucose, Bld 135 (*)    BUN 21 (*)    AST 61 (*)    All other components within normal limits  LIPASE, BLOOD - Abnormal; Notable for the following:    Lipase 1382 (*)    All other components within normal limits  URINALYSIS COMPLETEWITH MICROSCOPIC ([facility] ONLY)   ____________________________________________  EKG  Not indicated ____________________________________________  RADIOLOGY  No results found.  ____________________________________________   PROCEDURES  Procedure(s) performed: None  Critical Care performed: No ____________________________________________   INITIAL IMPRESSION / ASSESSMENT AND PLAN / ED COURSE  Pertinent labs & imaging results that were available during my care of the patient were reviewed by me and considered in my medical decision making (see chart for details).  69 y.o. male with a history of GERD, PUD, and pancreatitis presenting with acute onset of epigastric and periumbilical pain with associated nausea and vomiting. The patient is afebrile. Considerations included pancreatitis, gallbladder disease, or obstruction is the most likely etiology. Consider viral or foodborne GI illness. Much less likely acute cardiac cause.  ----------------------------------------- 8:07 PM on 09/16/2015 -----------------------------------------  Patient has a lipase and clinical picture that is  suggestive of pancreatitis. Given the amount of pain that he has an inability to keep down fluids, we'll plan to admit him. I'm awaiting the results of a CT scan.  ----------------------------------------- 8:56 PM on 09/16/2015 -----------------------------------------  The patient's CT scan is also consistent with pancreatic otitis. Plan to admit hospitalist. ____________________________________________  FINAL CLINICAL IMPRESSION(S) / ED DIAGNOSES  Final diagnoses:  Epigastric pain  Acute pancreatitis, unspecified pancreatitis type      NEW MEDICATIONS STARTED DURING THIS VISIT:  New Prescriptions   No medications on file     Rockne Menghini, MD 09/16/15 2056

## 2015-09-16 NOTE — ED Notes (Signed)
States he developed some abd pain about lunch time today   Then started to vomited around 3 pm

## 2015-09-17 LAB — CBC
HCT: 42.2 % (ref 40.0–52.0)
Hemoglobin: 14.1 g/dL (ref 13.0–18.0)
MCH: 30.7 pg (ref 26.0–34.0)
MCHC: 33.3 g/dL (ref 32.0–36.0)
MCV: 92.2 fL (ref 80.0–100.0)
PLATELETS: 234 10*3/uL (ref 150–440)
RBC: 4.57 MIL/uL (ref 4.40–5.90)
RDW: 13.6 % (ref 11.5–14.5)
WBC: 13.2 10*3/uL — ABNORMAL HIGH (ref 3.8–10.6)

## 2015-09-17 LAB — BASIC METABOLIC PANEL
Anion gap: 8 (ref 5–15)
BUN: 18 mg/dL (ref 6–20)
CALCIUM: 8.7 mg/dL — AB (ref 8.9–10.3)
CO2: 26 mmol/L (ref 22–32)
CREATININE: 0.85 mg/dL (ref 0.61–1.24)
Chloride: 102 mmol/L (ref 101–111)
GFR calc non Af Amer: >60 mL/min (ref >60)
Glucose, Bld: 102 mg/dL — ABNORMAL HIGH (ref 65–99)
Potassium: 3.6 mmol/L (ref 3.5–5.1)
SODIUM: 136 mmol/L (ref 135–145)

## 2015-09-17 LAB — LIPASE, BLOOD: LIPASE: 1088 U/L — AB (ref 11–51)

## 2015-09-17 MED ORDER — AMLODIPINE BESYLATE 5 MG PO TABS
5.0000 mg | ORAL_TABLET | Freq: Every day | ORAL | Status: DC
  Administered 2015-09-17 – 2015-09-19 (×3): 5 mg via ORAL
  Filled 2015-09-17 (×3): qty 1

## 2015-09-17 MED ORDER — SODIUM CHLORIDE 0.9 % IV SOLN
INTRAVENOUS | Status: AC
  Administered 2015-09-17: 12:00:00 via INTRAVENOUS

## 2015-09-17 NOTE — Progress Notes (Signed)
Called Dr. Anne HahnWillis per patient request about changing frequency of pain medication to q2 instead of q3 prn.  Doctor changed frequency and dose of pain medication.  Arturo MortonClay, Kalene Cutler N  09/17/2015  3:41 AM

## 2015-09-17 NOTE — Progress Notes (Signed)
[facility] [place] [facility]   PATIENT NAME: Ralph LollingCarl Melendez    MR#:  [id number]  DATE OF BIRTH:  August 11, 1946  SUBJECTIVE: 69 year old male patient with epigastric pain found to have acute pancreatitis. 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 Lipase is down today. Still has abdominal pain and nausea.   CHIEF COMPLAINT:   Chief Complaint  Patient presents with  . Abdominal Pain    REVIEW OF SYSTEMS:   Review of Systems  Constitutional: Negative for fever and chills.  HENT: Negative for hearing loss.   Eyes: Negative for blurred vision, double vision and photophobia.  Respiratory: Negative for cough, hemoptysis and shortness of breath.   Cardiovascular: Negative for palpitations, orthopnea and leg swelling.  Gastrointestinal: Positive for nausea and abdominal pain. Negative for vomiting and diarrhea.  Genitourinary: Negative for dysuria and urgency.  Musculoskeletal: Negative for myalgias and neck pain.  Skin: Negative for rash.  Neurological: Negative for dizziness, focal weakness, seizures, weakness and headaches.  Psychiatric/Behavioral: Negative for memory loss. The patient does not have insomnia.      DRUG ALLERGIES:   Allergies  Allergen Reactions  . Contrast Media [Iodinated Diagnostic Agents] Anaphylaxis  . Shrimp [Shellfish Allergy] Anaphylaxis    VITALS:  Blood pressure 142/69, pulse 74, temperature 97.4 F (36.3 C), temperature source Oral, resp. rate 14, height 5\' 10"  (1.778 m), weight 102.014 kg (224 lb 14.4 oz), SpO2 98 %.  PHYSICAL EXAMINATION:  GENERAL:  69 y.o.-year-old patient lying in the bed with no acute distress.  EYES: Pupils equal, round, reactive to light and accommodation. No scleral icterus. Extraocular muscles intact.  HEENT: Head atraumatic, normocephalic. Oropharynx and nasopharynx clear.  NECK:  Supple, no jugular venous distention. No thyroid enlargement, no tenderness.  LUNGS: Normal breath  sounds bilaterally, no wheezing, rales,rhonchi or crepitation. No use of accessory muscles of respiration.  CARDIOVASCULAR: S1, S2 normal. No murmurs, rubs, or gallops.  ABDOMEN: Epigastric tenderness present. right upper quadrant. Tenderness present Bowelsoundspresent EXTREMITIES: No pedal edema, cyanosis, or clubbing.  NEUROLOGIC: Cranial nerves II through XII are intact. Muscle strength 5/5 in all extremities. Sensation intact. Gait not checked.  PSYCHIATRIC: The patient is alert and oriented x 3.  SKIN: No obvious rash, lesion, or ulcer.    LABORATORY PANEL:   CBC  Recent Labs Lab 09/17/15 0514  WBC 13.2*  HGB 14.1  HCT 42.2  PLT 234   ------------------------------------------------------------------------------------------------------------------  Chemistries   Recent Labs Lab 09/16/15 1818 09/17/15 0514  NA 136 136  K 3.9 3.6  CL 102 102  CO2 27 26  GLUCOSE 135* 102*  BUN 21* 18  CREATININE 0.94 0.85  CALCIUM 9.4 8.7*  AST 61*  --   ALT 60  --   ALKPHOS 58  --   BILITOT 0.8  --    ------------------------------------------------------------------------------------------------------------------  Cardiac Enzymes No results for input(s): TROPONINI in the last 168 hours. ------------------------------------------------------------------------------------------------------------------  RADIOLOGY:  Ct Abdomen Pelvis Wo Contrast  09/16/2015  CLINICAL DATA:  69 year old male with acute abdominal and pelvic pain with vomiting today. EXAM: CT ABDOMEN AND PELVIS WITHOUT CONTRAST TECHNIQUE: Multidetector CT imaging of the abdomen and pelvis was performed following the standard protocol without IV contrast. COMPARISON:  05/09/2011 CT FINDINGS: Please note that parenchymal abnormalities may be missed without intravenous contrast. Lower chest:  Mild left basilar scarring noted. Hepatobiliary: Mild hepatic steatosis again identified. No definite focal hepatic abnormalities  are noted. The gallbladder is unremarkable. There is no evidence of biliary dilatation.  Pancreas: Mild peripancreatic inflammation with small amount of fluid in the left anterior para renal space is compatible with pancreatitis. There is no evidence of pancreatic necrosis, hemorrhage or venous thrombosis. Spleen: Unremarkable Adrenals/Urinary Tract: The kidneys, adrenal glands and bladder are unremarkable except for an unchanged left renal cyst. Stomach/Bowel: Evidence of prior gastric and enteric surgery identified. There is no evidence of bowel obstruction or definite bowel focal bowel wall thickening. Descending and sigmoid colonic diverticulosis noted without evidence of diverticulitis. The appendix is normal. Vascular/Lymphatic: Aortic atherosclerotic calcifications noted without aneurysm. There is no evidence of enlarged lymph nodes. Reproductive: Prostate unremarkable Other: No free fluid, focal collection or pneumoperitoneum. Musculoskeletal: No acute or suspicious abnormalities. IMPRESSION: Peripancreatic inflammation and small amount of retroperitoneal fluid, compatible with acute pancreatitis. No evidence of biliary dilatation, pancreatic necrosis, hemorrhage or venous thrombosis. Mild hepatic steatosis, colonic diverticulosis and aortic atherosclerosis again identified. Electronically Signed   By: Harmon Pier M.D.   On: 09/16/2015 20:40    EKG:   Orders placed or performed in visit on 05/09/11  . EKG 12-Lead    ASSESSMENT AND PLAN:   #1 acute pancreatitis with elevated lipase: LFTs are normal. Abdominal CAT scan showed gallbladder unremarkable, no evidence of biliary dilatation. Does have acute pancreatic edema. We will check a HIDA scan for gallbladder function, serum lipase to evaluate for hypertriglyceridemia. Had a history of  Gastroparesis,,gastric outlet obstruction status post gastrojejunostomy,h/o small bowel resection. 2 history of GERD continue PPIs #3 hypertension;restart  norvasc. 4.h/o carotid artery disease s/p cea;on plavix.   All the records are reviewed and case discussed with Care Management/Social Workerr. Management plans discussed with the patient, family and they are in agreement.  CODE STATUS: full  TOTAL TIME TAKING CARE OF THIS PATIENT: 35 minutes.   POSSIBLE D/C IN 1-2 DAYS, DEPENDING ON CLINICAL CONDITION.   Katha Hamming M.D on 09/17/2015 at 11:56 AM  Between 7am to 6pm - Pager - [phone]  After 6pm go to [email or web address] - [email or web address]  [facility]  Office  [phone]  CC: Primary care physician; Jaclyn Shaggy, MD   Note: This dictation was prepared with Dragon dictation along with smaller phrase technology. Any transcriptional errors that result from this process are unintentional.

## 2015-09-18 LAB — LIPID PANEL
Cholesterol: 114 mg/dL (ref 0–200)
HDL: 33 mg/dL — ABNORMAL LOW (ref >40)
LDL CALC: 67 mg/dL (ref 0–99)
TRIGLYCERIDES: 69 mg/dL (ref <150)
Total CHOL/HDL Ratio: 3.5 RATIO
VLDL: 14 mg/dL (ref 0–40)

## 2015-09-18 LAB — LIPASE, BLOOD: Lipase: 99 U/L — ABNORMAL HIGH (ref 11–51)

## 2015-09-18 MED ORDER — TECHNETIUM TC 99M MEBROFENIN IV KIT
5.4800 | PACK | Freq: Once | INTRAVENOUS | Status: AC | PRN
  Administered 2015-09-18: 5.484 via INTRAVENOUS

## 2015-09-18 MED ORDER — SODIUM CHLORIDE 0.9 % IV SOLN
INTRAVENOUS | Status: DC
  Administered 2015-09-18 – 2015-09-19 (×3): via INTRAVENOUS

## 2015-09-18 NOTE — Progress Notes (Signed)
[facility]   PATIENT NAME: Yuniel Blaney    MR#:  [id number]  DATE OF BIRTH:  12-06-1945  SUBJECTIVE: 69 year old male patient with epigastric pain found to have acute pancreatitis. 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 Lipase  decreased to 99 today. Patient feels better and no abdominal pain no nausea.   CHIEF COMPLAINT:   Chief Complaint  Patient presents with  . Abdominal Pain    REVIEW OF SYSTEMS:   Review of Systems  Constitutional: Negative for fever and chills.  HENT: Negative for hearing loss.   Eyes: Negative for blurred vision, double vision and photophobia.  Respiratory: Negative for cough, hemoptysis and shortness of breath.   Cardiovascular: Negative for palpitations, orthopnea and leg swelling.  Gastrointestinal: Positive for nausea and abdominal pain. Negative for vomiting and diarrhea.  Genitourinary: Negative for dysuria and urgency.  Musculoskeletal: Negative for myalgias and neck pain.  Skin: Negative for rash.  Neurological: Negative for dizziness, focal weakness, seizures, weakness and headaches.  Psychiatric/Behavioral: Negative for memory loss. The patient does not have insomnia.      DRUG ALLERGIES:   Allergies  Allergen Reactions  . Contrast Media [Iodinated Diagnostic Agents] Anaphylaxis  . Shrimp [Shellfish Allergy] Anaphylaxis    VITALS:  Blood pressure 148/82, pulse 84, temperature 98.9 F (37.2 C), temperature source Oral, resp. rate 17, height  (1.778 m), weight 102.014 kg (224 lb 14.4 oz), SpO2 95 %.  PHYSICAL EXAMINATION:  GENERAL:  69 y.o.-year-old patient lying in the bed with no acute distress.  EYES: Pupils equal, round, reactive to light and accommodation. No scleral icterus. Extraocular muscles intact.  HEENT: Head atraumatic, normocephalic. Oropharynx and nasopharynx clear.  NECK:  Supple, no jugular venous distention. No thyroid enlargement, no  tenderness.  LUNGS: Normal breath sounds bilaterally, no wheezing, rales,rhonchi or crepitation. No use of accessory muscles of respiration.  CARDIOVASCULAR: S1, S2 normal. No murmurs, rubs, or gallops.  ABDOMEN: No epigastric tenderness today.. Tenderness present Bowelsoundspresent EXTREMITIES: No pedal edema, cyanosis, or clubbing.  NEUROLOGIC: Cranial nerves II through XII are intact. Muscle strength 5/5 in all extremities. Sensation intact. Gait not checked.  PSYCHIATRIC: The patient is alert and oriented x 3.  SKIN: No obvious rash, lesion, or ulcer.    LABORATORY PANEL:   CBC  Recent Labs Lab 09/17/15 0514  WBC 13.2*  HGB 14.1  HCT 42.2  PLT 234   ------------------------------------------------------------------------------------------------------------------  Chemistries   Recent Labs Lab 09/16/15 1818 09/17/15 0514  NA 136 136  K 3.9 3.6  CL 102 102  CO2 27 26  GLUCOSE 135* 102*  BUN 21* 18  CREATININE 0.94 0.85  CALCIUM 9.4 8.7*  AST 61*  --   ALT 60  --   ALKPHOS 58  --   BILITOT 0.8  --    ------------------------------------------------------------------------------------------------------------------  Cardiac Enzymes No results for input(s): TROPONINI in the last 168 hours. ------------------------------------------------------------------------------------------------------------------  RADIOLOGY:  Ct Abdomen Pelvis Wo Contrast  09/16/2015  CLINICAL DATA:  69 year old male with acute abdominal and pelvic pain with vomiting today. EXAM: CT ABDOMEN AND PELVIS WITHOUT CONTRAST TECHNIQUE: Multidetector CT imaging of the abdomen and pelvis was performed following the standard protocol without IV contrast. COMPARISON:  05/09/2011 CT FINDINGS: Please note that parenchymal abnormalities may be missed without intravenous contrast. Lower chest:  Mild left basilar scarring noted. Hepatobiliary: Mild hepatic steatosis again identified. No definite focal hepatic  abnormalities are noted. The gallbladder is unremarkable. There is no evidence  of biliary dilatation. Pancreas: Mild peripancreatic inflammation with small amount of fluid in the left anterior para renal space is compatible with pancreatitis. There is no evidence of pancreatic necrosis, hemorrhage or venous thrombosis. Spleen: Unremarkable Adrenals/Urinary Tract: The kidneys, adrenal glands and bladder are unremarkable except for an unchanged left renal cyst. Stomach/Bowel: Evidence of prior gastric and enteric surgery identified. There is no evidence of bowel obstruction or definite bowel focal bowel wall thickening. Descending and sigmoid colonic diverticulosis noted without evidence of diverticulitis. The appendix is normal. Vascular/Lymphatic: Aortic atherosclerotic calcifications noted without aneurysm. There is no evidence of enlarged lymph nodes. Reproductive: Prostate unremarkable Other: No free fluid, focal collection or pneumoperitoneum. Musculoskeletal: No acute or suspicious abnormalities. IMPRESSION: Peripancreatic inflammation and small amount of retroperitoneal fluid, compatible with acute pancreatitis. No evidence of biliary dilatation, pancreatic necrosis, hemorrhage or venous thrombosis. Mild hepatic steatosis, colonic diverticulosis and aortic atherosclerosis again identified. Electronically Signed   By: Harmon PierJeffrey  Hu M.D.   On: 09/16/2015 20:40    EKG:   Orders placed or performed in visit on 05/09/11  . EKG 12-Lead    ASSESSMENT AND PLAN:   #1 acute pancreatitis with elevated lipase: LFTs are normal. Abdominal CAT scan showed gallbladder unremarkable, no evidence of biliary dilatation. Does have acute pancreatic edema. HIDA scan today. Lipid panel normal.start clear liquids after HIDA.GI consult   . Had a history of  Gastroparesis,,gastric outlet obstruction status post gastrojejunostomy,h/o small bowel resection.. Reviewed all the old medical records spent about 20 minutes. In  reviewing them. 2 history of GERD continue PPIs #3 hypertension;restarted norvasc. 4.h/o carotid artery disease s/p cea;on plavix.   All the records are reviewed and case discussed with Care Management/Social Workerr. Management plans discussed with the patient, family and they are in agreement.  CODE STATUS: full  TOTAL TIME TAKING CARE OF THIS PATIENT: 35 minutes.   POSSIBLE D/C IN 1-2 DAYS, DEPENDING ON CLINICAL CONDITION.   Katha HammingKONIDENA,Anay Walter M.D on 09/18/2015 at 10:52 AM  Between 7am to 6pm - Pager - [phone]  After 6pm go to [email or web address] - password [email or web address]  [facility]  Office  [phone]  CC: Primary care physician; Jaclyn ShaggyATE,DENNY C, MD   Note: This dictation was prepared with Dragon dictation along with smaller phrase technology. Any transcriptional errors that result from this process are unintentional.

## 2015-09-18 NOTE — Consult Note (Signed)
See consult note Sung AmabileCari Richards PA.  Case discussed with her.  Sudden onset of pancreatitis with sudden improvement with lipase going from 1100 to normal suggests passed small gall stone as a real possibility.  If he can afford it I recommend Actigall 300mg  bid.  Will check with her insurance.  No interventions at this time.

## 2015-09-18 NOTE — Progress Notes (Signed)
Initial Nutrition Assessment     INTERVENTION:  Meals and snacks: Await diet progression   NUTRITION DIAGNOSIS:   Inadequate oral intake related to altered GI function as evidenced by NPO status.    GOAL:   Patient will meet greater than or equal to 90% of their needs    MONITOR:    (Energy intake, Digestive system)  REASON FOR ASSESSMENT:   Diagnosis    ASSESSMENT:      Pt admitted with acute pancreatitis  Past Medical History  Diagnosis Date  . GERD (gastroesophageal reflux disease)   . Ruptured disk 2011  . Stomach ulcer   . Pancreatitis   . HTN (hypertension)   . TIA (transient ischemic attack)   . Carotid artery disease (HCC)     Current Nutrition: NPO  Food/Nutrition-Related History: normal intake prior to admission   Scheduled Medications:  . amLODipine  5 mg Oral Daily  . clopidogrel  75 mg Oral Daily  . enoxaparin (LOVENOX) injection  40 mg Subcutaneous Q24H  . pantoprazole (PROTONIX) IV  40 mg Intravenous Q24H  . sodium chloride  3 mL Intravenous Q12H    Continuous Medications:  . sodium chloride 75 mL/hr at 09/18/15 0116     Electrolyte/Renal Profile and Glucose Profile:   Recent Labs Lab 09/16/15 1818 09/17/15 0514  NA 136 136  K 3.9 3.6  CL 102 102  CO2 27 26  BUN 21* 18  CREATININE 0.94 0.85  CALCIUM 9.4 8.7*  GLUCOSE 135* 102*   Protein Profile:  Recent Labs Lab 09/16/15 1818  ALBUMIN 4.4    Gastrointestinal Profile: Last BM:11/28    Weight Change: stable wt     Diet Order:  Diet NPO time specified Except for: Sips with Meds  Skin:   reviewed  Last BM:     Height:   Ht Readings from Last 1 Encounters:  09/16/15 5\' 10"  (1.778 m)    Weight:   Wt Readings from Last 1 Encounters:  09/16/15 224 lb 14.4 oz (102.014 kg)    Ideal Body Weight:     BMI:  Body mass index is 32.27 kg/(m^2).   EDUCATION NEEDS:   No education needs identified at this time  LOW Care Level  Xanthe Couillard B. Freida BusmanAllen, RD,  LDN [phone] (pager)

## 2015-09-18 NOTE — Consult Note (Signed)
GI Inpatient Consult Note  Reason for Consult: Idiopathic Pancreatitis   Attending Requesting Consult: Dr. Luberta Mutter  History of Present Illness: Ralph Melendez is a 69 y.o. male who reports he started having abdominal pain along with nausea and vomiting on Monday morning.  He reports the pain became intolerable, 10/10, reported to the emergency department.  His lipase on admission was 1382. He reports his pain dissipated yesterday afternoon and has continued to be a 0/10 today.  His lipase dropped to 99 yesterday.  He had an episode of pancreatitis 3 or 4 years ago of unexplained  etiology.  He denies alcohol use.  His triglycerides were tested on this hospitalization and were within normal limits.  He had a CT of the abdomen that showed no evidence of biliary dilatation, pancreatic necrosis, hemorrhage or venous thrombosis. HIDA scan was unremarkable.  He did have most of his clear liquids at lunch today without issue.  He does have a significant past medical history of stomach issues.  He had severe PUD in 1985 requiring a Billroth-II and ever since then he has had difficulty with movement of this food with associated nausea and vomiting.  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  He underwent an open in gastrojejunostomy revision, small bowel  resection in February, 2016 at [facility].  He reports since then he has no longer had issues with eating, nausea or vomiting.  His last colonoscopy was in January, 2016 with the findings of diverticulosis in the sigmoid colon, otherwise unremarkable.  Due for repeat in 10 years.  He denies any heartburn or acid reflux symptoms.  Does not take a daily PPI.  He is on Plavix due to carotid stenosis.  Significant smoking history, stopped smoking 09/2014.  Past Medical History:  Past Medical History  Diagnosis Date  . GERD (gastroesophageal reflux disease)   . Ruptured disk 2011   . Stomach ulcer   . Pancreatitis   . HTN (hypertension)   . TIA (transient ischemic attack)   . Carotid artery disease (HCC)     Problem List: Patient Active Problem List   Diagnosis Date Noted  . Pancreatitis, acute 09/16/2015  . HTN (hypertension) 09/16/2015  . Carotid artery disease (HCC) 09/16/2015  . GERD (gastroesophageal reflux disease) 09/16/2015  . Encounter for screening colonoscopy 07/25/2014    Past Surgical History: Past Surgical History  Procedure Laterality Date  . Colonoscopy  2005  . Carotid  2008    cleaning  . Back surgery  2011  . Stomach surgery  [id number]  . Upper gi endoscopy      x 3    Allergies: Allergies  Allergen Reactions  . Contrast Media [Iodinated Diagnostic Agents] Anaphylaxis  . Shrimp [Shellfish Allergy] Anaphylaxis    Home Medications: Prescriptions prior to admission  Medication Sig Dispense Refill Last Dose  . amLODipine (NORVASC) 5 MG tablet Take 5 mg by mouth daily.   09/16/2015 at Unknown time  . clopidogrel (PLAVIX) 75 MG tablet Take 75 mg by mouth daily.   09/16/2015 at 0800   Home medication reconciliation was completed with the patient.   Scheduled Inpatient Medications:   . amLODipine  5 mg Oral Daily  . clopidogrel  75 mg Oral Daily  . enoxaparin (LOVENOX) injection  40 mg Subcutaneous Q24H  . pantoprazole (PROTONIX) IV  40 mg Intravenous Q24H  . sodium chloride  3 mL Intravenous Q12H  Continuous Inpatient Infusions:   . sodium chloride 75 mL/hr at 09/18/15 1442    PRN Inpatient Medications:  acetaminophen **OR** acetaminophen, HYDROmorphone (DILAUDID) injection, ondansetron **OR** ondansetron (ZOFRAN) IV  Family History: family history includes Breast cancer in his mother.    Social History:   reports that he quit smoking about a year ago. His smoking use included Cigarettes. He has a 56 pack-year smoking history. He does not have any smokeless tobacco history on file. He reports that he does not drink  alcohol or use illicit drugs.    Review of Systems: Constitutional: Weight is stable.  Eyes: No changes in vision. ENT: No oral lesions, sore throat.  GI: see HPI.  Heme/Lymph: No easy bruising.  CV: No chest pain.  GU: No hematuria.  Integumentary: No rashes.  Neuro: No headaches.  Psych: No depression/anxiety.  Endocrine: No heat/cold intolerance.  Allergic/Immunologic: No urticaria.  Resp: No cough, SOB.  Musculoskeletal: No joint swelling.    Physical Examination: BP 142/63 mmHg  Pulse 83  Temp(Src) 98 F (36.7 C) (Oral)  Resp 17  Ht 5\' 10"  (1.778 m)  Wt 102.014 kg (224 lb 14.4 oz)  BMI 32.27 kg/m2  SpO2 98% Gen: NAD, alert and oriented x 4 HEENT: PEERLA, EOMI, Neck: supple, no JVD or thyromegaly Chest: CTA bilaterally, no wheezes, crackles, or other adventitious sounds CV: RRR, no m/g/c/r Abd: soft, NT, ND, +BS in all four quadrants; no HSM, guarding, ridigity, or rebound tenderness Ext: no edema, well perfused with 2+ pulses, Skin: no rash or lesions noted Lymph: no LAD  Data: Lab Results  Component Value Date   WBC 13.2* 09/17/2015   HGB 14.1 09/17/2015   HCT 42.2 09/17/2015   MCV 92.2 09/17/2015   PLT 234 09/17/2015    Recent Labs Lab 09/16/15 1818 09/17/15 0514  HGB 14.9 14.1   Lab Results  Component Value Date   NA 136 09/17/2015   K 3.6 09/17/2015   CL 102 09/17/2015   CO2 26 09/17/2015   BUN 18 09/17/2015   CREATININE 0.85 09/17/2015   Lab Results  Component Value Date   ALT 60 09/16/2015   AST 61* 09/16/2015   ALKPHOS 58 09/16/2015   BILITOT 0.8 09/16/2015   No results for input(s): APTT, INR, PTT in the last 168 hours.  Imaging: CLINICAL DATA: 69 year old male with acute abdominal and pelvic pain with vomiting today.  EXAM: CT ABDOMEN AND PELVIS WITHOUT CONTRAST  TECHNIQUE: Multidetector CT imaging of the abdomen and pelvis was performed following the standard protocol without IV contrast.  COMPARISON: 05/09/2011  CT  FINDINGS: Please note that parenchymal abnormalities may be missed without intravenous contrast.  Lower chest: Mild left basilar scarring noted.  Hepatobiliary: Mild hepatic steatosis again identified. No definite focal hepatic abnormalities are noted. The gallbladder is unremarkable. There is no evidence of biliary dilatation.  Pancreas: Mild peripancreatic inflammation with small amount of fluid in the left anterior para renal space is compatible with pancreatitis. There is no evidence of pancreatic necrosis, hemorrhage or venous thrombosis.  Spleen: Unremarkable  Adrenals/Urinary Tract: The kidneys, adrenal glands and bladder are unremarkable except for an unchanged left renal cyst.  Stomach/Bowel: Evidence of prior gastric and enteric surgery identified. There is no evidence of bowel obstruction or definite bowel focal bowel wall thickening. Descending and sigmoid colonic diverticulosis noted without evidence of diverticulitis. The appendix is normal.  Vascular/Lymphatic: Aortic atherosclerotic calcifications noted without aneurysm. There is no evidence of enlarged lymph nodes.  Reproductive: Prostate unremarkable  Other: No free fluid, focal collection or pneumoperitoneum.  Musculoskeletal: No acute or suspicious abnormalities.  IMPRESSION: Peripancreatic inflammation and small amount of retroperitoneal fluid, compatible with acute pancreatitis. No evidence of biliary dilatation, pancreatic necrosis, hemorrhage or venous thrombosis.  Mild hepatic steatosis, colonic diverticulosis and aortic atherosclerosis again identified.   Electronically Signed  By: Harmon Pier M.D.  On: 09/16/2015 20:40  CLINICAL DATA: Abdomen pain. Pancreatitis.  EXAM: NUCLEAR MEDICINE HEPATOBILIARY IMAGING  TECHNIQUE: Sequential images of the abdomen were obtained out to 60 minutes following intravenous administration of  radiopharmaceutical.  RADIOPHARMACEUTICALS: 5.484 mCi Tc-73m Choletec IV  COMPARISON: None.  FINDINGS: The gallbladder is seen within 41 minutes, within normal limits. The small bowel is seen within 15 minutes, within normal limits.  IMPRESSION: Normal hepatobiliary scan.   Electronically Signed  By: Sherian Rein M.D.  On: 09/18/2015 13:59  Assessment/Plan: Mr. 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  Given high lipase on admission and fast recovery, he may have had a stone in the CBD that passed.  Recommendations: We recommend patient be started on Actigall  BID to possibly prevent stones from forming in the future.  He should follow up in our office in 1-2 months. We will continue to follow with you.  Thank you for the consult. Please call with questions or concerns.  Carney Harder, PA-C  I personally performed these services.

## 2015-09-18 NOTE — Care Management Important Message (Signed)
Important Message  Patient Details  Name: Ralph Melendez MRN: [id number] Date of Birth: [birth year]   Medicare Important Message Given:  Yes    Olegario MessierKathy A Tarika Mckethan 09/18/2015, 10:12 AM

## 2015-09-19 LAB — LIPASE, BLOOD: LIPASE: 26 U/L (ref 11–51)

## 2015-09-19 MED ORDER — URSODIOL 300 MG PO CAPS: 300.0000 mg | ORAL_CAPSULE | Freq: Two times a day (BID) | ORAL | Status: DC

## 2015-09-19 NOTE — Discharge Instructions (Signed)
Follow all MD discharge instructions. Take all medications as prescribed. Keep all follow up appointments. If your symptoms return, call your doctor. If you experience any new symptoms that are of concern to you or that are bothersome to you, call your doctor. For all questions and/or concerns, call your doctor.   If you have a medical emergency, call 911   Acute Pancreatitis Acute pancreatitis is a disease in which the pancreas becomes suddenly inflamed. The pancreas is a large gland located behind your stomach. The pancreas produces enzymes that help digest food. The pancreas also releases the hormones glucagon and insulin that help regulate blood sugar. Damage to the pancreas occurs when the digestive enzymes from the pancreas are activated and begin attacking the pancreas before being released into the intestine. Most acute attacks last a couple of days and can cause serious complications. Some people become dehydrated and develop low blood pressure. In severe cases, bleeding into the pancreas can lead to shock and can be life-threatening. The lungs, heart, and kidneys may fail. CAUSES  Pancreatitis can happen to anyone. In some cases, the cause is unknown. Most cases are caused by:  Alcohol abuse.  Gallstones. Other less common causes are:  Certain medicines.  Exposure to certain chemicals.  Infection.  Damage caused by an accident (trauma).  Abdominal surgery. SYMPTOMS   Pain in the upper abdomen that may radiate to the back.  Tenderness and swelling of the abdomen.  Nausea and vomiting. DIAGNOSIS  Your caregiver will perform a physical exam. Blood and stool tests may be done to confirm the diagnosis. Imaging tests may also be done, such as X-rays, CT scans, or an ultrasound of the abdomen. TREATMENT  Treatment usually requires a stay in the hospital. Treatment may include:  Pain medicine.  Fluid replacement through an intravenous line (IV).  Placing a tube in the  stomach to remove stomach contents and control vomiting.  Not eating for 3 or 4 days. This gives your pancreas a rest, because enzymes are not being produced that can cause further damage.  Antibiotic medicines if your condition is caused by an infection.  Surgery of the pancreas or gallbladder. HOME CARE INSTRUCTIONS   Follow the diet advised by your caregiver. This may involve avoiding alcohol and decreasing the amount of fat in your diet.  Eat smaller, more frequent meals. This reduces the amount of digestive juices the pancreas produces.  Drink enough fluids to keep your urine clear or pale yellow.  Only take over-the-counter or prescription medicines as directed by your caregiver.  Avoid drinking alcohol if it caused your condition.  Do not smoke.  Get plenty of rest.  Check your blood sugar at home as directed by your caregiver.  Keep all follow-up appointments as directed by your caregiver. SEEK MEDICAL CARE IF:   You do not recover as quickly as expected.  You develop new or worsening symptoms.  You have persistent pain, weakness, or nausea.  You recover and then have another episode of pain. SEEK IMMEDIATE MEDICAL CARE IF:   You are unable to eat or keep fluids down.  Your pain becomes severe.  You have a fever or persistent symptoms for more than 2 to 3 days.  You have a fever and your symptoms suddenly get worse.  Your skin or the white part of your eyes turn yellow (jaundice).  You develop vomiting.  You feel dizzy, or you faint.  Your blood sugar is high (over 300 mg/dL). MAKE SURE YOU:  Understand these instructions.  Will watch your condition.  Will get help right away if you are not doing well or get worse.   This information is not intended to replace advice given to you by your health care provider. Make sure you discuss any questions you have with your health care provider.   Document Released: 10/05/2005 Document Revised: 04/05/2012  Document Reviewed: 01/14/2012 Elsevier Interactive Patient Education [employer]2016 [employer]

## 2015-09-19 NOTE — Discharge Summary (Signed)
[facility] [place] at [facility]   PATIENT NAME: Ralph Melendez    MR#:  [id number]  DATE OF BIRTH:  [birth year]  DATE OF ADMISSION:  09/16/2015 ADMITTING PHYSICIAN: Oralia Manisavid Willis, MD  DATE OF DISCHARGE: 09/19/2015  PRIMARY CARE PHYSICIAN: Jaclyn ShaggyATE,DENNY C, MD    ADMISSION DIAGNOSIS:  Epigastric pain [R10.13] Acute pancreatitis, unspecified pancreatitis type [K85.9]  DISCHARGE DIAGNOSIS:  Principal Problem:   Pancreatitis, acute Active Problems:   HTN (hypertension)   Carotid artery disease (HCC)   GERD (gastroesophageal reflux disease)   SECONDARY DIAGNOSIS:   Past Medical History  Diagnosis Date  . GERD (gastroesophageal reflux disease)   . Ruptured disk 2011  . Stomach ulcer   . Pancreatitis   . HTN (hypertension)   . TIA (transient ischemic attack)   . Carotid artery disease [facility](HCC)     HOSPITAL COURSE:  69 year old male with a history of essential hypertension who presented with abdominal pain and was found to have acute pancreatitis. For further details please further H&P.  1. Acute pancreatitis: Patient was treated for acute pancreatitis with nothing by mouth and when necessary IV pain medications and antiemetics. Lipase level had significantly improved by day 2. GI was consulted for further evaluation. It was felt the patient likely passed a small gallstone. He is currently tolerating his diet. GI recommended Actigall 300 mg twice a day.  2. Essential hypertension: Patient will continue on Norvasc.  3. History of TIA: Continue Plavix  DISCHARGE CONDITIONS AND DIET:   Patient will be discharged home on a heart healthy diet in stable condition  CONSULTS OBTAINED:  Treatment Team:  Scot Junobert T Elliott, MD  DRUG ALLERGIES:   Allergies  Allergen Reactions  . Contrast Media [Iodinated Diagnostic Agents] Anaphylaxis  . Shrimp [Shellfish Allergy] Anaphylaxis    DISCHARGE MEDICATIONS:   Current Discharge Medication List    START taking  these medications   Details  ursodiol (ACTIGALL) 300 MG capsule Take 1 capsule (300 mg total) by mouth 2 (two) times daily. Qty: 60 capsule, Refills: 0      CONTINUE these medications which have NOT CHANGED   Details  amLODipine (NORVASC) 5 MG tablet Take 5 mg by mouth daily.    clopidogrel (PLAVIX) 75 MG tablet Take 75 mg by mouth daily.              Today   CHIEF COMPLAINT:  Patient is doing well this morning. Patient has no complaints. Patient tolerated his diet. Patient denies abdominal pain.   VITAL SIGNS:  Blood pressure 129/70, pulse 76, temperature 98.2 F (36.8 C), temperature source Oral, resp. rate 17, height 5\' 10"  (1.778 m), weight 102.014 kg (224 lb 14.4 oz), SpO2 98 %.   REVIEW OF SYSTEMS:  Review of Systems  Constitutional: Negative for fever, chills and malaise/fatigue.  HENT: Negative for sore throat.   Eyes: Negative for blurred vision.  Respiratory: Negative for cough, hemoptysis, shortness of breath and wheezing.   Cardiovascular: Negative for chest pain, palpitations and leg swelling.  Gastrointestinal: Negative for nausea, vomiting, abdominal pain, diarrhea and blood in stool.  Genitourinary: Negative for dysuria.  Musculoskeletal: Negative for back pain.  Neurological: Negative for dizziness, tremors and headaches.  Endo/Heme/Allergies: Does not bruise/bleed easily.     PHYSICAL EXAMINATION:  GENERAL:  69 y.o.-year-old patient lying in the bed with no acute distress.  NECK:  Supple, no jugular venous distention. No thyroid enlargement, no tenderness.  LUNGS: Normal breath sounds bilaterally, no wheezing, rales,rhonchi  No  use of accessory muscles of respiration.  CARDIOVASCULAR: S1, S2 normal. No murmurs, rubs, or gallops.  ABDOMEN: Soft, non-tender, non-distended. Bowel sounds present. No organomegaly or mass.  EXTREMITIES: No pedal edema, cyanosis, or clubbing.  PSYCHIATRIC: The patient is alert and oriented x 3.  SKIN: No obvious  rash, lesion, or ulcer.   DATA REVIEW:   CBC  Recent Labs Lab 09/17/15 0514  WBC 13.2*  HGB 14.1  HCT 42.2  PLT 234    Chemistries   Recent Labs Lab 09/16/15 1818 09/17/15 0514  NA 136 136  K 3.9 3.6  CL 102 102  CO2 27 26  GLUCOSE 135* 102*  BUN 21* 18  CREATININE 0.94 0.85  CALCIUM 9.4 8.7*  AST 61*  --   ALT 60  --   ALKPHOS 58  --   BILITOT 0.8  --     Cardiac Enzymes No results for input(s): TROPONINI in the last 168 hours.  Microbiology Results  @  RADIOLOGY:  Nm Hepatobiliary Liver Func  09/18/2015  CLINICAL DATA:  Abdomen pain.  Pancreatitis. EXAM: NUCLEAR MEDICINE HEPATOBILIARY IMAGING TECHNIQUE: Sequential images of the abdomen were obtained out to 60 minutes following intravenous administration of radiopharmaceutical. RADIOPHARMACEUTICALS:  5.484 mCi Tc-33m Choletec IV COMPARISON:  None. FINDINGS: The gallbladder is seen within 41 minutes, within normal limits. The small bowel is seen within 15 minutes, within normal limits. IMPRESSION: Normal hepatobiliary scan. Electronically Signed   By: Sherian Rein M.D.   On: 09/18/2015 13:59      Management plans discussed with the patient and he is in agreement. Stable for discharge home  Patient should follow up with GI in 4 weeks  CODE STATUS:     Code Status Orders        Start     Ordered   09/16/15 2229  Full code   Continuous     09/16/15 2228    Advance Directive Documentation        Most Recent Value   Type of Advance Directive  Living will   Pre-existing out of facility DNR order (yellow form or pink MOST form)     "MOST" Form in Place?        TOTAL TIME TAKING CARE OF THIS PATIENT: 35 minutes.    Note: This dictation was prepared with Dragon dictation along with smaller phrase technology. Any transcriptional errors that result from this process are unintentional.  Eljay Lave M.D on 09/19/2015 at 11:50 AM  Between 7am to 6pm - Pager - [phone] After 6pm go to  [email or web address] - password [email or web address] [facility]  [facility]  Office  [phone]  CC: Primary care physician; Jaclyn Shaggy, MD

## 2015-09-19 NOTE — Progress Notes (Signed)
Pt d/c home; d/c instructions reviewed w/ pt; pt understanding was verbalized; IV removed catheter in tact, gauze dressing applied; all pt questions answered; pt left unit via wheelchair accompanied by staff 

## 2016-11-09 ENCOUNTER — Telehealth: Payer: Self-pay | Admitting: *Deleted

## 2016-11-09 NOTE — Telephone Encounter (Signed)
Received referral for initial lung cancer screening scan. Contacted patient and obtained smoking history,(former, quit 10/07/14, 56 pack year) as well as answering questions related to screening process. Patient denies signs of lung cancer such as weight loss or hemoptysis. Patient denies comorbidity that would prevent curative treatment if lung cancer were found. Needs late PM appt. Will contact patient in near future when alternate time is available.

## 2016-11-11 ENCOUNTER — Telehealth: Payer: Self-pay | Admitting: *Deleted

## 2016-11-11 NOTE — Telephone Encounter (Signed)
Received referral for low dose lung cancer screening CT scan. Voicemail left at phone number listed in EMR for patient to call me back to facilitate scheduling scan. Now have later afternoon availability.

## 2016-11-12 ENCOUNTER — Telehealth: Payer: Self-pay | Admitting: *Deleted

## 2016-11-12 DIAGNOSIS — Z87891 Personal history of nicotine dependence: Secondary | ICD-10-CM

## 2016-11-12 NOTE — Telephone Encounter (Signed)
Agreeable to lung screening scan 11/25/16 at 2:30pm.

## 2016-11-24 ENCOUNTER — Other Ambulatory Visit: Payer: Self-pay | Admitting: *Deleted

## 2016-11-24 DIAGNOSIS — Z87891 Personal history of nicotine dependence: Secondary | ICD-10-CM

## 2016-11-25 ENCOUNTER — Ambulatory Visit
Admission: RE | Admit: 2016-11-25 | Discharge: 2016-11-25 | Disposition: A | Discharge status: Home / Self Care | Payer: Medicare Other | Source: Ambulatory Visit | Attending: Oncology | Admitting: Oncology

## 2016-11-25 ENCOUNTER — Inpatient Hospital Stay: Payer: Medicare Other | Attending: Oncology | Admitting: Oncology

## 2016-11-25 ENCOUNTER — Encounter (INDEPENDENT_AMBULATORY_CARE_PROVIDER_SITE_OTHER): Payer: Self-pay

## 2016-11-25 DIAGNOSIS — Z87891 Personal history of nicotine dependence: Secondary | ICD-10-CM | POA: Insufficient documentation

## 2016-11-25 DIAGNOSIS — I2510 Atherosclerotic heart disease of native coronary artery without angina pectoris: Secondary | ICD-10-CM | POA: Insufficient documentation

## 2016-11-25 DIAGNOSIS — Z122 Encounter for screening for malignant neoplasm of respiratory organs: Secondary | ICD-10-CM | POA: Diagnosis not present

## 2016-11-25 DIAGNOSIS — I700 Atherosclerosis of aorta: Secondary | ICD-10-CM | POA: Insufficient documentation

## 2016-11-25 DIAGNOSIS — K760 Fatty (change of) liver, not elsewhere classified: Secondary | ICD-10-CM | POA: Insufficient documentation

## 2016-11-25 DIAGNOSIS — R918 Other nonspecific abnormal finding of lung field: Secondary | ICD-10-CM | POA: Diagnosis not present

## 2016-11-25 DIAGNOSIS — J432 Centrilobular emphysema: Secondary | ICD-10-CM | POA: Diagnosis not present

## 2016-11-25 NOTE — Progress Notes (Signed)
In accordance with CMS guidelines, patient has met eligibility criteria including age, absence of signs or symptoms of lung cancer.  Social History  Substance Use Topics  . Smoking status: Former Smoker    Packs/day: 1.00    Years: 56.00    Types: Cigarettes    Quit date: 10/07/2014  . Smokeless tobacco: Not on file  . Alcohol use No     A shared decision-making session was conducted prior to the performance of CT scan. This includes one or more decision aids, includes benefits and harms of screening, follow-up diagnostic testing, over-diagnosis, false positive rate, and total radiation exposure.  Counseling on the importance of adherence to annual lung cancer LDCT screening, impact of co-morbidities, and ability or willingness to undergo diagnosis and treatment is imperative for compliance of the program.  Counseling on the importance of continued smoking cessation for former smokers; the importance of smoking cessation for current smokers, and information about tobacco cessation interventions have been given to patient including [place] and 1800 quit [place] programs.  Written order for lung cancer screening with LDCT has been given to the patient and any and all questions have been answered to the best of my abilities.   Yearly follow up will be coordinated by Burgess Estelle, Thoracic Navigator.  Lucendia Herrlich, NP  11/25/16 2:56 PM

## 2016-11-27 ENCOUNTER — Encounter: Payer: Self-pay | Admitting: *Deleted

## 2016-11-30 ENCOUNTER — Encounter: Payer: Self-pay | Admitting: *Deleted

## 2017-03-18 ENCOUNTER — Other Ambulatory Visit: Payer: Self-pay | Admitting: Internal Medicine

## 2017-03-18 ENCOUNTER — Ambulatory Visit
Admission: RE | Admit: 2017-03-18 | Discharge: 2017-03-18 | Disposition: A | Discharge status: Home / Self Care | Payer: Medicare Other | Source: Ambulatory Visit | Attending: Internal Medicine | Admitting: Internal Medicine

## 2017-03-18 DIAGNOSIS — M25521 Pain in right elbow: Secondary | ICD-10-CM | POA: Diagnosis not present

## 2017-03-18 DIAGNOSIS — W1789XA Other fall from one level to another, initial encounter: Secondary | ICD-10-CM | POA: Insufficient documentation

## 2017-03-18 DIAGNOSIS — M7989 Other specified soft tissue disorders: Secondary | ICD-10-CM | POA: Diagnosis not present

## 2017-03-18 DIAGNOSIS — W19XXXA Unspecified fall, initial encounter: Secondary | ICD-10-CM

## 2017-11-17 ENCOUNTER — Telehealth: Payer: Self-pay | Admitting: *Deleted

## 2017-11-17 DIAGNOSIS — Z87891 Personal history of nicotine dependence: Secondary | ICD-10-CM

## 2017-11-17 DIAGNOSIS — Z122 Encounter for screening for malignant neoplasm of respiratory organs: Secondary | ICD-10-CM

## 2017-11-17 NOTE — Telephone Encounter (Signed)
Notified patient that annual lung cancer screening low dose CT scan is due currently or will be in near future. Confirmed that patient is within the age range of 55-77, and asymptomatic, (no signs or symptoms of lung cancer). Patient denies illness that would prevent curative treatment for lung cancer if found. Verified smoking history, (former, quit 2015, 56 pack year). The shared decision making visit was done 11/25/16. Patient is agreeable for CT scan being scheduled.

## 2017-11-26 ENCOUNTER — Ambulatory Visit
Admission: RE | Admit: 2017-11-26 | Discharge: 2017-11-26 | Disposition: A | Discharge status: Home / Self Care | Payer: Medicare HMO | Source: Ambulatory Visit | Attending: Nurse Practitioner | Admitting: Nurse Practitioner

## 2017-11-26 DIAGNOSIS — Z87891 Personal history of nicotine dependence: Secondary | ICD-10-CM | POA: Diagnosis not present

## 2017-11-26 DIAGNOSIS — J432 Centrilobular emphysema: Secondary | ICD-10-CM | POA: Diagnosis not present

## 2017-11-26 DIAGNOSIS — I700 Atherosclerosis of aorta: Secondary | ICD-10-CM | POA: Diagnosis not present

## 2017-11-26 DIAGNOSIS — Z122 Encounter for screening for malignant neoplasm of respiratory organs: Secondary | ICD-10-CM | POA: Diagnosis present

## 2017-11-29 ENCOUNTER — Encounter: Payer: Self-pay | Admitting: *Deleted

## 2018-07-12 ENCOUNTER — Encounter (INDEPENDENT_AMBULATORY_CARE_PROVIDER_SITE_OTHER): Payer: Medicare HMO | Admitting: Ophthalmology

## 2018-07-12 DIAGNOSIS — H43813 Vitreous degeneration, bilateral: Secondary | ICD-10-CM | POA: Diagnosis not present

## 2018-07-12 DIAGNOSIS — H35033 Hypertensive retinopathy, bilateral: Secondary | ICD-10-CM | POA: Diagnosis not present

## 2018-07-12 DIAGNOSIS — I10 Essential (primary) hypertension: Secondary | ICD-10-CM

## 2018-07-12 DIAGNOSIS — H33022 Retinal detachment with multiple breaks, left eye: Secondary | ICD-10-CM

## 2018-07-12 DIAGNOSIS — H2513 Age-related nuclear cataract, bilateral: Secondary | ICD-10-CM | POA: Diagnosis not present

## 2018-07-13 ENCOUNTER — Encounter (INDEPENDENT_AMBULATORY_CARE_PROVIDER_SITE_OTHER): Payer: Medicare HMO | Admitting: Ophthalmology

## 2018-07-20 ENCOUNTER — Encounter (INDEPENDENT_AMBULATORY_CARE_PROVIDER_SITE_OTHER): Payer: Medicare HMO | Admitting: Ophthalmology

## 2018-07-20 DIAGNOSIS — H33302 Unspecified retinal break, left eye: Secondary | ICD-10-CM

## 2018-11-12 ENCOUNTER — Telehealth: Payer: Self-pay

## 2018-11-12 NOTE — Telephone Encounter (Signed)
Call pt regarding lung screening.Pt is a former smoker. Pt want to be on same day as spouse. They would like scan to be around 2pm. No new health issues at this time.

## 2018-11-16 ENCOUNTER — Telehealth: Payer: Self-pay | Admitting: *Deleted

## 2018-11-16 DIAGNOSIS — Z122 Encounter for screening for malignant neoplasm of respiratory organs: Secondary | ICD-10-CM

## 2018-11-16 NOTE — Telephone Encounter (Signed)
Called pt to inform pt of  His appt for ldct screening Wednesday 11/30/2018 here @ [facility] @ 2:30pm, voiced understanding.

## 2018-11-16 NOTE — Telephone Encounter (Signed)
Patient has been notified that the annual lung cancer screening low dose CT scan is due currently or will be in the near future.  Confirmed that the patient is within the age range of 37-80, and asymptomatic, and currently exhibits no signs or symptoms of lung cancer.  Patient denies illness that would prevent curative treatment for lung cancer if found.  Verified smoking history, former smoker with 56pkyr history.  The shared decision making visit was completed on 11-25-16.  Patient is agreeable for the CT scan to be scheduled.  Will call patient back with date and time of appointment.

## 2018-11-21 ENCOUNTER — Encounter (INDEPENDENT_AMBULATORY_CARE_PROVIDER_SITE_OTHER): Payer: Medicare HMO | Admitting: Ophthalmology

## 2018-11-25 ENCOUNTER — Encounter (INDEPENDENT_AMBULATORY_CARE_PROVIDER_SITE_OTHER): Payer: Medicare HMO | Admitting: Ophthalmology

## 2018-11-25 DIAGNOSIS — H35033 Hypertensive retinopathy, bilateral: Secondary | ICD-10-CM

## 2018-11-25 DIAGNOSIS — I10 Essential (primary) hypertension: Secondary | ICD-10-CM | POA: Diagnosis not present

## 2018-11-25 DIAGNOSIS — H43813 Vitreous degeneration, bilateral: Secondary | ICD-10-CM

## 2018-11-25 DIAGNOSIS — H33302 Unspecified retinal break, left eye: Secondary | ICD-10-CM

## 2018-11-25 DIAGNOSIS — H2513 Age-related nuclear cataract, bilateral: Secondary | ICD-10-CM

## 2018-11-30 ENCOUNTER — Ambulatory Visit
Admission: RE | Admit: 2018-11-30 | Discharge: 2018-11-30 | Disposition: A | Discharge status: Home / Self Care | Payer: Medicare HMO | Source: Ambulatory Visit | Attending: Nurse Practitioner | Admitting: Nurse Practitioner

## 2018-11-30 DIAGNOSIS — Z122 Encounter for screening for malignant neoplasm of respiratory organs: Secondary | ICD-10-CM

## 2018-12-01 ENCOUNTER — Telehealth: Payer: Self-pay | Admitting: *Deleted

## 2018-12-01 NOTE — Telephone Encounter (Signed)
Notified patient of LDCT lung cancer screening program results with recommendation for (*) month follow up imaging.  Also notified of incidental findings noted below and is encouraged to discuss further questions with PCP who will receive a copy of this not and/or the CT reports.  Patient verbalized understanding.      IMPRESSION: 1. New right pulmonary nodules measuring up to 5.2 mm. Lung-RADS 3, probably benign findings. Short-term follow-up in 6 months is recommended with repeat low-dose chest CT without contrast (please use the following order, "CT CHEST LCS NODULE FOLLOW-UP W/O CM"). 2.  Emphysema. (ICD10-J43.9) 3.  Aortic Atherosclerois (ICD10-170.0)

## 2018-12-01 NOTE — Telephone Encounter (Signed)
error 

## 2018-12-01 NOTE — Telephone Encounter (Signed)
Called pt to go over ldct screening results, unable to reach pt at this time, will call back.

## 2019-05-16 ENCOUNTER — Encounter: Payer: Self-pay | Admitting: Emergency Medicine

## 2019-05-16 ENCOUNTER — Emergency Department: Payer: Medicare HMO

## 2019-05-16 ENCOUNTER — Other Ambulatory Visit: Payer: Self-pay

## 2019-05-16 ENCOUNTER — Emergency Department
Admission: EM | Admit: 2019-05-16 | Discharge: 2019-05-16 | Disposition: A | Discharge status: Home / Self Care | Payer: Medicare HMO | Attending: Emergency Medicine | Admitting: Emergency Medicine

## 2019-05-16 DIAGNOSIS — Z79899 Other long term (current) drug therapy: Secondary | ICD-10-CM | POA: Diagnosis not present

## 2019-05-16 DIAGNOSIS — I2510 Atherosclerotic heart disease of native coronary artery without angina pectoris: Secondary | ICD-10-CM | POA: Diagnosis not present

## 2019-05-16 DIAGNOSIS — R1032 Left lower quadrant pain: Secondary | ICD-10-CM | POA: Diagnosis present

## 2019-05-16 DIAGNOSIS — I10 Essential (primary) hypertension: Secondary | ICD-10-CM | POA: Diagnosis not present

## 2019-05-16 DIAGNOSIS — Z8673 Personal history of transient ischemic attack (TIA), and cerebral infarction without residual deficits: Secondary | ICD-10-CM | POA: Diagnosis not present

## 2019-05-16 DIAGNOSIS — Z7902 Long term (current) use of antithrombotics/antiplatelets: Secondary | ICD-10-CM | POA: Diagnosis not present

## 2019-05-16 DIAGNOSIS — Z87891 Personal history of nicotine dependence: Secondary | ICD-10-CM | POA: Insufficient documentation

## 2019-05-16 DIAGNOSIS — R109 Unspecified abdominal pain: Secondary | ICD-10-CM

## 2019-05-16 LAB — COMPREHENSIVE METABOLIC PANEL
ALT: 23 U/L (ref 0–44)
AST: 24 U/L (ref 15–41)
Albumin: 4.3 g/dL (ref 3.5–5.0)
Alkaline Phosphatase: 61 U/L (ref 38–126)
Anion gap: 9 (ref 5–15)
BUN: 19 mg/dL (ref 8–23)
CO2: 23 mmol/L (ref 22–32)
Calcium: 9.1 mg/dL (ref 8.9–10.3)
Chloride: 104 mmol/L (ref 98–111)
Creatinine, Ser: 0.96 mg/dL (ref 0.61–1.24)
GFR calc Af Amer: >60 mL/min (ref >60)
GFR calc non Af Amer: >60 mL/min (ref >60)
Glucose, Bld: 118 mg/dL — ABNORMAL HIGH (ref 70–99)
Potassium: 4.5 mmol/L (ref 3.5–5.1)
Sodium: 136 mmol/L (ref 135–145)
Total Bilirubin: 0.8 mg/dL (ref 0.3–1.2)
Total Protein: 7.8 g/dL (ref 6.5–8.1)

## 2019-05-16 LAB — URINALYSIS, COMPLETE (UACMP) WITH MICROSCOPIC
Bacteria, UA: NONE SEEN
Bilirubin Urine: NEGATIVE
Glucose, UA: NEGATIVE mg/dL
Hgb urine dipstick: NEGATIVE
Ketones, ur: NEGATIVE mg/dL
Leukocytes,Ua: NEGATIVE
Nitrite: NEGATIVE
Protein, ur: NEGATIVE mg/dL
Specific Gravity, Urine: 1.016 (ref 1.005–1.030)
Squamous Epithelial / LPF: NONE SEEN (ref 0–5)
pH: 6 (ref 5.0–8.0)

## 2019-05-16 LAB — LIPASE, BLOOD: Lipase: 22 U/L (ref 11–51)

## 2019-05-16 LAB — CBC
HCT: 44.4 % (ref 39.0–52.0)
Hemoglobin: 14.8 g/dL (ref 13.0–17.0)
MCH: 30.9 pg (ref 26.0–34.0)
MCHC: 33.3 g/dL (ref 30.0–36.0)
MCV: 92.7 fL (ref 80.0–100.0)
Platelets: 219 10*3/uL (ref 150–400)
RBC: 4.79 MIL/uL (ref 4.22–5.81)
RDW: 12.8 % (ref 11.5–15.5)
WBC: 7.9 10*3/uL (ref 4.0–10.5)
nRBC: 0 % (ref 0.0–0.2)

## 2019-05-16 MED ORDER — BARIUM SULFATE 2.1 % PO SUSP
450.0000 mL | ORAL | Status: AC
  Administered 2019-05-16: 450 mL via ORAL

## 2019-05-16 MED ORDER — SODIUM CHLORIDE 0.9% FLUSH: 3.0000 mL | Freq: Once | INTRAVENOUS | Status: DC

## 2019-05-16 NOTE — Discharge Instructions (Signed)

## 2019-05-16 NOTE — ED Provider Notes (Signed)
[facility] Emergency Department Provider Note  Time seen: 1:50 PM  I have reviewed the triage vital signs and the nursing notes.   HISTORY  Chief Complaint Abdominal Pain  HPI Ralph Melendez is a 73 y.o. male with a past medical history of gastric reflux, hypertension, pancreatitis, gastric ulcers, presents emergency department for lower abdominal pain.  Cording to the patient for the past 1 week he has been experiencing intermittent dull aching pain across the lower abdomen.  States nausea with one episode of vomiting.  Denies any diarrhea.  Denies any upper abdominal pain.  Denies any fever.  No cough or congestion.  Denies dysuria or hematuria.   Past Medical History:  Diagnosis Date  . Carotid artery disease ([place])   . GERD (gastroesophageal reflux disease)   . HTN (hypertension)   . Pancreatitis   . Pancreatitis   . Ruptured disk 2011  . Stomach ulcer   . TIA (transient ischemic attack)     Patient Active Problem List   Diagnosis Date Noted  . Personal history of tobacco use, presenting hazards to health 11/25/2016  . Pancreatitis, acute 09/16/2015  . HTN (hypertension) 09/16/2015  . Carotid artery disease ([place]) 09/16/2015  . GERD (gastroesophageal reflux disease) 09/16/2015  . Encounter for screening colonoscopy 07/25/2014    Past Surgical History:  Procedure Laterality Date  . BACK SURGERY  2011  . carotid  2008   cleaning  . COLONOSCOPY  2005  . NECK SURGERY    . STOMACH SURGERY  [phone]  . UPPER GI ENDOSCOPY     x 3    Prior to Admission medications   Medication Sig Start Date End Date Taking? Authorizing Provider  amLODipine (NORVASC) 5 MG tablet Take 5 mg by mouth daily.    [provider]  clopidogrel (PLAVIX) 75 MG tablet Take 75 mg by mouth daily.    [provider]  ursodiol (ACTIGALL) 300 MG capsule Take 1 capsule (300 mg total) by mouth 2 (two) times daily. 09/19/15   Bettey Costa, MD    Allergies   Allergen Reactions  . Contrast Media [Iodinated Diagnostic Agents] Anaphylaxis  . Shrimp [Shellfish Allergy] Anaphylaxis    Family History  Problem Relation Age of Onset  . Breast cancer Mother     Social History Social History   Tobacco Use  . Smoking status: Former Smoker    Packs/day: 1.00    Years: 56.00    Pack years: 56.00    Types: Cigarettes    Quit date: 10/07/2014    Years since quitting: 4.6  . Smokeless tobacco: Never Used  Substance Use Topics  . Alcohol use: No    Alcohol/week: 0.0 standard drinks  . Drug use: No    Review of Systems Constitutional: Negative for fever. Cardiovascular: Negative for chest pain. Respiratory: Negative for shortness of breath. Gastrointestinal: Upper abdominal pain.  Negative for diarrhea.  One episode of vomiting. Genitourinary: Negative for urinary compaints Musculoskeletal: Negative for musculoskeletal complaints Skin: Negative for skin complaints  Neurological: Negative for headache All other ROS negative  ____________________________________________   PHYSICAL EXAM:  VITAL SIGNS: ED Triage Vitals [05/16/19 1229]  Enc Vitals Group     BP (!) 142/89     Pulse Rate 70     Resp 16     Temp 98.7 F (37.1 C)     Temp Source Oral     SpO2 97 %     Weight      Height  Head Circumference      Peak Flow      Pain Score 5     Pain Loc      Pain Edu?      Excl. in GC?    Constitutional: Alert and oriented. Well appearing and in no distress. Eyes: Normal exam ENT      Head: Normocephalic and atraumatic.      Mouth/Throat: Mucous membranes are moist. Cardiovascular: Normal rate, regular rhythm. No murmur Respiratory: Normal respiratory effort without tachypnea nor retractions. Breath sounds are clear  Gastrointestinal: Soft, very mild left lower quadrant tenderness palpation.  Otherwise benign abdominal exam.  Without rebound guarding or distention. Musculoskeletal: Nontender with normal range of motion in  all extremities.  Neurologic:  Normal speech and language. No gross focal neurologic deficits  Skin:  Skin is warm, dry and intact.  Psychiatric: Mood and affect are normal.   ____________________________________________    RADIOLOGY  IMPRESSION:  1. No acute abdominal or pelvic pathology.  2. Gallbladder sludge versus cholelithiasis.  3. Aortic Atherosclerosis (ICD10-I70.0).   ____________________________________________   INITIAL IMPRESSION / ASSESSMENT AND PLAN / ED COURSE  Pertinent labs & imaging results that were available during my care of the patient were reviewed by me and considered in my medical decision making (see chart for details).   Patient presents to the emergency department for lower abdominal pain x1 week.  Differential would include colitis, diverticulitis, ureterolithiasis, UTI.  Patient's lab work is largely nonrevealing, normal white blood cell count, normal urinalysis, normal lipase and LFTs.  We will obtain CT scan without contrast due to IV dye allergy to further evaluate.  Patient agreeable to plan of care.  Ralph FillersCarl L Melendez was evaluated in Emergency Department on 05/16/2019 for the symptoms described in the history of present illness. He was evaluated in the context of the global COVID-19 pandemic, which necessitated consideration that the patient might be at risk for infection with the SARS-CoV-2 virus that causes COVID-19. Institutional protocols and algorithms that pertain to the evaluation of patients at risk for COVID-19 are in a state of rapid change based on information released by regulatory bodies including the CDC and federal and state organizations. These policies and algorithms were followed during the patient's care in the ED.  ____________________________________________   FINAL CLINICAL IMPRESSION(S) / ED DIAGNOSES  Lower abdominal pain   Ralph Melendez, Ralph Derenzo, MD 05/17/19 [phone]

## 2019-05-16 NOTE — ED Provider Notes (Signed)
Patient received in signout from Dr. Kerman Passey.  Patient observed standing at his room door.  Went to check on patient.  He is denying any abdominal pain at this time.  His work-up has been unrevealing.  He denies any chest pain or pressure.  Particular does not have any right upper quadrant pain on deep palpation.  This does not seem consistent with acute cholecystitis or cholelithiasis.  There is no evidence of pancreatitis.  Clinically does not seem to be referred pain from thoracic etiology.  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  Patient was able to tolerate PO and was able to ambulate with a steady gait.   At this point I do believe he stable and appropriate for outpatient follow-up.   Merlyn Lot, MD 05/16/19 [phone]

## 2019-05-16 NOTE — ED Triage Notes (Signed)
Says pain across low abd and rad into back since last week.  Says vomited Friday.  His doctor told him  To come in to be checked for pancreatitis (has history of that) but patient says it does not feel like that.

## 2019-05-16 NOTE — ED Notes (Signed)
Resume care from [place] rn   Pt alert.  Pt waiting on ct scan.

## 2019-05-18 ENCOUNTER — Other Ambulatory Visit: Payer: Self-pay | Admitting: Student

## 2019-05-18 DIAGNOSIS — R109 Unspecified abdominal pain: Secondary | ICD-10-CM

## 2019-05-18 DIAGNOSIS — R1111 Vomiting without nausea: Secondary | ICD-10-CM

## 2019-05-18 DIAGNOSIS — R1033 Periumbilical pain: Secondary | ICD-10-CM

## 2019-05-30 ENCOUNTER — Other Ambulatory Visit: Payer: Self-pay

## 2019-05-30 ENCOUNTER — Encounter
Admission: RE | Admit: 2019-05-30 | Discharge: 2019-05-30 | Disposition: A | Discharge status: Home / Self Care | Payer: Medicare HMO | Source: Ambulatory Visit | Attending: Student | Admitting: Student

## 2019-05-30 DIAGNOSIS — R1111 Vomiting without nausea: Secondary | ICD-10-CM

## 2019-05-30 DIAGNOSIS — R109 Unspecified abdominal pain: Secondary | ICD-10-CM | POA: Insufficient documentation

## 2019-05-30 DIAGNOSIS — R1033 Periumbilical pain: Secondary | ICD-10-CM | POA: Diagnosis not present

## 2019-05-30 MED ORDER — TECHNETIUM TC 99M MEBROFENIN IV KIT
5.4600 | PACK | Freq: Once | INTRAVENOUS | Status: AC | PRN
  Administered 2019-05-30: 12:00:00 5.46 via INTRAVENOUS

## 2019-06-02 ENCOUNTER — Other Ambulatory Visit: Payer: Self-pay

## 2019-06-02 ENCOUNTER — Ambulatory Visit
Admission: RE | Admit: 2019-06-02 | Discharge: 2019-06-02 | Disposition: A | Discharge status: Home / Self Care | Payer: Medicare HMO | Source: Ambulatory Visit | Attending: Student | Admitting: Student

## 2019-06-02 DIAGNOSIS — R1033 Periumbilical pain: Secondary | ICD-10-CM | POA: Diagnosis present

## 2019-06-02 DIAGNOSIS — R109 Unspecified abdominal pain: Secondary | ICD-10-CM | POA: Diagnosis not present

## 2019-06-02 DIAGNOSIS — R1111 Vomiting without nausea: Secondary | ICD-10-CM

## 2019-06-12 ENCOUNTER — Telehealth: Payer: Self-pay | Admitting: *Deleted

## 2019-06-12 DIAGNOSIS — Z122 Encounter for screening for malignant neoplasm of respiratory organs: Secondary | ICD-10-CM

## 2019-06-12 DIAGNOSIS — Z87891 Personal history of nicotine dependence: Secondary | ICD-10-CM

## 2019-06-12 DIAGNOSIS — R918 Other nonspecific abnormal finding of lung field: Secondary | ICD-10-CM

## 2019-06-12 NOTE — Telephone Encounter (Signed)
Contacted and scheduled for LCS nodule f/u scan

## 2019-06-14 ENCOUNTER — Ambulatory Visit
Admission: RE | Admit: 2019-06-14 | Discharge: 2019-06-14 | Disposition: A | Discharge status: Home / Self Care | Payer: Medicare HMO | Source: Ambulatory Visit | Attending: Nurse Practitioner | Admitting: Nurse Practitioner

## 2019-06-14 ENCOUNTER — Other Ambulatory Visit: Payer: Self-pay

## 2019-06-14 DIAGNOSIS — R918 Other nonspecific abnormal finding of lung field: Secondary | ICD-10-CM

## 2019-06-14 DIAGNOSIS — Z122 Encounter for screening for malignant neoplasm of respiratory organs: Secondary | ICD-10-CM | POA: Insufficient documentation

## 2019-06-14 DIAGNOSIS — Z87891 Personal history of nicotine dependence: Secondary | ICD-10-CM | POA: Diagnosis present

## 2019-06-16 ENCOUNTER — Encounter: Payer: Self-pay | Admitting: *Deleted

## 2019-06-29 ENCOUNTER — Other Ambulatory Visit: Admission: RE | Admit: 2019-06-29 | Payer: Medicare HMO | Source: Ambulatory Visit

## 2019-07-03 ENCOUNTER — Encounter: Admission: RE | Payer: Self-pay | Source: Home / Self Care

## 2019-07-03 ENCOUNTER — Ambulatory Visit: Admission: RE | Admit: 2019-07-03 | Payer: Medicare HMO | Source: Home / Self Care | Admitting: Internal Medicine

## 2019-07-03 SURGERY — ESOPHAGOGASTRODUODENOSCOPY (EGD) WITH PROPOFOL
Anesthesia: General

## 2019-07-16 ENCOUNTER — Other Ambulatory Visit: Payer: Self-pay

## 2019-07-16 ENCOUNTER — Emergency Department: Payer: Medicare HMO

## 2019-07-16 DIAGNOSIS — K5900 Constipation, unspecified: Secondary | ICD-10-CM | POA: Diagnosis not present

## 2019-07-16 DIAGNOSIS — K5641 Fecal impaction: Secondary | ICD-10-CM | POA: Insufficient documentation

## 2019-07-16 DIAGNOSIS — R1032 Left lower quadrant pain: Secondary | ICD-10-CM | POA: Insufficient documentation

## 2019-07-16 DIAGNOSIS — I10 Essential (primary) hypertension: Secondary | ICD-10-CM | POA: Diagnosis not present

## 2019-07-16 DIAGNOSIS — Z79899 Other long term (current) drug therapy: Secondary | ICD-10-CM | POA: Insufficient documentation

## 2019-07-16 DIAGNOSIS — R1031 Right lower quadrant pain: Secondary | ICD-10-CM | POA: Diagnosis not present

## 2019-07-16 DIAGNOSIS — Z87891 Personal history of nicotine dependence: Secondary | ICD-10-CM | POA: Diagnosis not present

## 2019-07-16 DIAGNOSIS — Z8673 Personal history of transient ischemic attack (TIA), and cerebral infarction without residual deficits: Secondary | ICD-10-CM | POA: Diagnosis not present

## 2019-07-16 LAB — COMPREHENSIVE METABOLIC PANEL
ALT: 30 U/L (ref 0–44)
AST: 33 U/L (ref 15–41)
Albumin: 3.9 g/dL (ref 3.5–5.0)
Alkaline Phosphatase: 63 U/L (ref 38–126)
Anion gap: 11 (ref 5–15)
BUN: 19 mg/dL (ref 8–23)
CO2: 22 mmol/L (ref 22–32)
Calcium: 9.1 mg/dL (ref 8.9–10.3)
Chloride: 106 mmol/L (ref 98–111)
Creatinine, Ser: 0.85 mg/dL (ref 0.61–1.24)
GFR calc Af Amer: >60 mL/min (ref >60)
GFR calc non Af Amer: >60 mL/min (ref >60)
Glucose, Bld: 162 mg/dL — ABNORMAL HIGH (ref 70–99)
Potassium: 3.7 mmol/L (ref 3.5–5.1)
Sodium: 139 mmol/L (ref 135–145)
Total Bilirubin: 1 mg/dL (ref 0.3–1.2)
Total Protein: 7.6 g/dL (ref 6.5–8.1)

## 2019-07-16 LAB — CBC WITH DIFFERENTIAL/PLATELET
Abs Immature Granulocytes: 0.02 10*3/uL (ref 0.00–0.07)
Basophils Absolute: 0.1 10*3/uL (ref 0.0–0.1)
Basophils Relative: 1 %
Eosinophils Absolute: 0.2 10*3/uL (ref 0.0–0.5)
Eosinophils Relative: 3 %
HCT: 43.5 % (ref 39.0–52.0)
Hemoglobin: 14.7 g/dL (ref 13.0–17.0)
Immature Granulocytes: 0 %
Lymphocytes Relative: 22 %
Lymphs Abs: 1.8 10*3/uL (ref 0.7–4.0)
MCH: 30.8 pg (ref 26.0–34.0)
MCHC: 33.8 g/dL (ref 30.0–36.0)
MCV: 91.2 fL (ref 80.0–100.0)
Monocytes Absolute: 0.7 10*3/uL (ref 0.1–1.0)
Monocytes Relative: 8 %
Neutro Abs: 5.4 10*3/uL (ref 1.7–7.7)
Neutrophils Relative %: 66 %
Platelets: 275 10*3/uL (ref 150–400)
RBC: 4.77 MIL/uL (ref 4.22–5.81)
RDW: 12.8 % (ref 11.5–15.5)
WBC: 8.2 10*3/uL (ref 4.0–10.5)
nRBC: 0 % (ref 0.0–0.2)

## 2019-07-16 NOTE — ED Triage Notes (Signed)
Pt states he had his gallbladder removed last week. Pt states he has not had a bowel movement since Monday and is now bleeding rectally. Pt denies vomiting. Pt states bleeding is " a little bit when I wipe".

## 2019-07-17 ENCOUNTER — Emergency Department: Payer: Medicare HMO

## 2019-07-17 ENCOUNTER — Emergency Department
Admission: EM | Admit: 2019-07-17 | Discharge: 2019-07-17 | Disposition: A | Discharge status: Home / Self Care | Payer: Medicare HMO | Attending: Emergency Medicine | Admitting: Emergency Medicine

## 2019-07-17 DIAGNOSIS — K5900 Constipation, unspecified: Secondary | ICD-10-CM

## 2019-07-17 DIAGNOSIS — K5641 Fecal impaction: Secondary | ICD-10-CM

## 2019-07-17 LAB — URINALYSIS, COMPLETE (UACMP) WITH MICROSCOPIC
Bacteria, UA: NONE SEEN
Bilirubin Urine: NEGATIVE
Glucose, UA: NEGATIVE mg/dL
Hgb urine dipstick: NEGATIVE
Ketones, ur: NEGATIVE mg/dL
Leukocytes,Ua: NEGATIVE
Nitrite: NEGATIVE
Protein, ur: NEGATIVE mg/dL
Specific Gravity, Urine: 1.02 (ref 1.005–1.030)
pH: 5 (ref 5.0–8.0)

## 2019-07-17 LAB — LIPASE, BLOOD: Lipase: 18 U/L (ref 11–51)

## 2019-07-17 MED ORDER — DOCUSATE SODIUM 100 MG PO CAPS: 100.0000 mg | ORAL_CAPSULE | Freq: Two times a day (BID) | ORAL | 1 refills | Status: AC

## 2019-07-17 MED ORDER — BISACODYL 10 MG RE SUPP: 10.0000 mg | RECTAL | 0 refills | Status: DC | PRN

## 2019-07-17 NOTE — ED Provider Notes (Signed)
[facility] Emergency Department Provider Note   ____________________________________________   First MD Initiated Contact with Patient 07/17/19 0114     (approximate)  I have reviewed the triage vital signs and the nursing notes.   HISTORY  Chief Complaint Constipation    HPI Ralph Melendez is a 73 y.o. male with past medical history of hypertension, CAD, pancreatitis who presents to the ED complaining of constipation.  Patient reports that he had a laparoscopic cholecystectomy performed a few days prior.  He has had difficulty with bowel movement since then, states he is only been passing a small amount of hard stool.  He now is developed bilateral lower quadrant abdominal pain, denies any nausea or vomiting.  He has been taking over-the-counter stool softener without relief, has not tried any laxatives.  He denies any fevers, chills, dysuria, or hematuria.  He has noticed a small amount of bleeding when he goes to wipe his bottom, denies any blood in his stool.        Past Medical History:  Diagnosis Date  . Carotid artery disease ([place])   . GERD (gastroesophageal reflux disease)   . HTN (hypertension)   . Pancreatitis   . Pancreatitis   . Ruptured disk 2011  . Stomach ulcer   . TIA (transient ischemic attack)     Patient Active Problem List   Diagnosis Date Noted  . Personal history of tobacco use, presenting hazards to health 11/25/2016  . Pancreatitis, acute 09/16/2015  . HTN (hypertension) 09/16/2015  . Carotid artery disease ([place]) 09/16/2015  . GERD (gastroesophageal reflux disease) 09/16/2015  . Encounter for screening colonoscopy 07/25/2014    Past Surgical History:  Procedure Laterality Date  . BACK SURGERY  2011  . carotid  2008   cleaning  . COLONOSCOPY  2005  . NECK SURGERY    . STOMACH SURGERY  [phone]  . UPPER GI ENDOSCOPY     x 3    Prior to Admission medications   Medication Sig Start Date End Date Taking?  Authorizing Provider  amLODipine (NORVASC) 5 MG tablet Take 5 mg by mouth daily.    [provider]  bisacodyl (DULCOLAX) 10 MG suppository Place 1 suppository (10 mg total) rectally as needed for moderate constipation. 07/17/19   Blake Divine, MD  clopidogrel (PLAVIX) 75 MG tablet Take 75 mg by mouth daily.    [provider]  docusate sodium (COLACE) 100 MG capsule Take 1 capsule (100 mg total) by mouth 2 (two) times daily. 07/17/19 09/15/19  Blake Divine, MD  ursodiol (ACTIGALL) 300 MG capsule Take 1 capsule (300 mg total) by mouth 2 (two) times daily. 09/19/15   Bettey Costa, MD    Allergies Contrast media [iodinated diagnostic agents] and Shrimp [shellfish allergy]  Family History  Problem Relation Age of Onset  . Breast cancer Mother     Social History Social History   Tobacco Use  . Smoking status: Former Smoker    Packs/day: 1.00    Years: 56.00    Pack years: 56.00    Types: Cigarettes    Quit date: 10/07/2014    Years since quitting: 4.7  . Smokeless tobacco: Never Used  Substance Use Topics  . Alcohol use: No    Alcohol/week: 0.0 standard drinks  . Drug use: No    Review of Systems  Constitutional: No fever/chills Eyes: No visual changes. ENT: No sore throat. Cardiovascular: Denies chest pain. Respiratory: Denies shortness of breath. Gastrointestinal: Positive for abdominal  pain.  No nausea, no vomiting.  No diarrhea.  Positive for constipation. Genitourinary: Negative for dysuria. Musculoskeletal: Negative for back pain. Skin: Negative for rash. Neurological: Negative for headaches, focal weakness or numbness.  ____________________________________________   PHYSICAL EXAM:  VITAL SIGNS: ED Triage Vitals  Enc Vitals Group     BP 07/16/19 2258 (!) 158/92     Pulse Rate 07/16/19 2258 95     Resp 07/16/19 2258 18     Temp 07/16/19 2258 98.1 F (36.7 C)     Temp Source 07/16/19 2258 Oral     SpO2 07/16/19 2258 96 %     Weight  07/16/19 2259 205 lb (93 kg)     Height 07/16/19 2259 5\' 10"  (1.778 m)     Head Circumference --      Peak Flow --      Pain Score 07/16/19 2259 5     Pain Loc --      Pain Edu? --      Excl. in GC? --     Constitutional: Alert and oriented. Eyes: Conjunctivae are normal. Head: Atraumatic. Nose: No congestion/rhinnorhea. Mouth/Throat: Mucous membranes are moist. Neck: Normal ROM Cardiovascular: Normal rate, regular rhythm. Grossly normal heart sounds. Respiratory: Normal respiratory effort.  No retractions. Lungs CTAB. Gastrointestinal: Soft with bilateral lower quadrant tenderness, no rebound or guarding. No distention.  Rectal exam with stool ball, no bleeding noted. Genitourinary: deferred Musculoskeletal: No lower extremity tenderness nor edema. Neurologic:  Normal speech and language. No gross focal neurologic deficits are appreciated. Skin:  Skin is warm, dry and intact. No rash noted. Psychiatric: Mood and affect are normal. Speech and behavior are normal.  ____________________________________________   LABS (all labs ordered are listed, but only abnormal results are displayed)  Labs Reviewed  COMPREHENSIVE METABOLIC PANEL - Abnormal; Notable for the following components:      Result Value   Glucose, Bld 162 (*)    All other components within normal limits  CBC WITH DIFFERENTIAL/PLATELET  LIPASE, BLOOD  URINALYSIS, COMPLETE (UACMP) WITH MICROSCOPIC   ____________________________________________  EKG  ED ECG REPORT I, Chesley Noonharles Kvion Shapley, the attending physician, personally viewed and interpreted this ECG.   Date: 07/17/2019  EKG Time: 23:10  Rate: 91  Rhythm: normal sinus rhythm, infrequent PVC  Axis: LAD  Intervals:left anterior fascicular block  ST&T Change: None    PROCEDURES  Procedure(s) performed (including Critical Care):  Procedures   ____________________________________________   INITIAL IMPRESSION / ASSESSMENT AND PLAN / ED COURSE        73 year old male status post recent lap cholecystectomy presents to the ED complaining of lower abdominal pain and constipation.  X-ray obtained from triage with nonspecific bowel gas pattern.  CT was obtained and shows fecal impaction with no evidence of bowel obstruction.  Disimpaction was attempted on 2 separate occasions, was able to break up small portion of stool ball, however much of it was not reachable.  Patient's labs are unremarkable, no evidence of significant GI bleeding.  Will start patient on bowel regimen of MiraLAX, fiber, stool softener, and Dulcolax suppositories as needed.  Counseled patient to follow-up with his PCP and return to the ED for new or worsening symptoms.  Patient agrees with plan.      ____________________________________________   FINAL CLINICAL IMPRESSION(S) / ED DIAGNOSES  Final diagnoses:  Constipation, unspecified constipation type  Fecal impaction [facility])     ED Discharge Orders         Ordered    docusate sodium (  COLACE) 100 MG capsule  2 times daily     07/17/19 0447    bisacodyl (DULCOLAX) 10 MG suppository  As needed     07/17/19 0447           Note:  This document was prepared using Dragon voice recognition software and may include unintentional dictation errors.   Chesley Noon, MD 07/17/19 [phone]

## 2019-07-17 NOTE — Discharge Instructions (Signed)
Your CT scan today did not show a bowel obstruction, but it did show evidence of a fecal impaction.  We were able to remove some of this manually, but there is still a significant amount of stool remaining.  Please take stool softener regularly as well as a fiber supplement.  You may buy MiraLAX over-the-counter, start using 1 capful twice daily, you may increase this up to 5 capfuls twice daily.  Additionally, you may use the Dulcolax suppositories as needed for constipation.  Please schedule follow-up with your primary care provider and return to the ER for worsening pain, ongoing difficulty with bowel movements, or any other concerning symptoms.

## 2019-10-03 ENCOUNTER — Encounter: Payer: Self-pay | Admitting: Emergency Medicine

## 2019-10-03 ENCOUNTER — Emergency Department: Payer: Medicare HMO

## 2019-10-03 ENCOUNTER — Other Ambulatory Visit: Payer: Self-pay

## 2019-10-03 ENCOUNTER — Emergency Department
Admission: EM | Admit: 2019-10-03 | Discharge: 2019-10-03 | Disposition: A | Discharge status: Home / Self Care | Payer: Medicare HMO | Attending: Emergency Medicine | Admitting: Emergency Medicine

## 2019-10-03 DIAGNOSIS — Z87891 Personal history of nicotine dependence: Secondary | ICD-10-CM | POA: Insufficient documentation

## 2019-10-03 DIAGNOSIS — K2970 Gastritis, unspecified, without bleeding: Secondary | ICD-10-CM | POA: Diagnosis not present

## 2019-10-03 DIAGNOSIS — Z79899 Other long term (current) drug therapy: Secondary | ICD-10-CM | POA: Diagnosis not present

## 2019-10-03 DIAGNOSIS — I259 Chronic ischemic heart disease, unspecified: Secondary | ICD-10-CM | POA: Insufficient documentation

## 2019-10-03 DIAGNOSIS — Z7902 Long term (current) use of antithrombotics/antiplatelets: Secondary | ICD-10-CM | POA: Insufficient documentation

## 2019-10-03 DIAGNOSIS — I10 Essential (primary) hypertension: Secondary | ICD-10-CM | POA: Diagnosis not present

## 2019-10-03 DIAGNOSIS — Z8673 Personal history of transient ischemic attack (TIA), and cerebral infarction without residual deficits: Secondary | ICD-10-CM | POA: Insufficient documentation

## 2019-10-03 DIAGNOSIS — R1012 Left upper quadrant pain: Secondary | ICD-10-CM | POA: Diagnosis not present

## 2019-10-03 DIAGNOSIS — K759 Inflammatory liver disease, unspecified: Secondary | ICD-10-CM

## 2019-10-03 DIAGNOSIS — K2900 Acute gastritis without bleeding: Secondary | ICD-10-CM

## 2019-10-03 DIAGNOSIS — R1010 Upper abdominal pain, unspecified: Secondary | ICD-10-CM

## 2019-10-03 DIAGNOSIS — B199 Unspecified viral hepatitis without hepatic coma: Secondary | ICD-10-CM | POA: Insufficient documentation

## 2019-10-03 LAB — TYPE AND SCREEN
ABO/RH(D): A POS
Antibody Screen: NEGATIVE

## 2019-10-03 LAB — COMPREHENSIVE METABOLIC PANEL
ALT: 194 U/L — ABNORMAL HIGH (ref 0–44)
ALT: 477 U/L — ABNORMAL HIGH (ref 0–44)
AST: 372 U/L — ABNORMAL HIGH (ref 15–41)
AST: 865 U/L — ABNORMAL HIGH (ref 15–41)
Albumin: 2.1 g/dL — ABNORMAL LOW (ref 3.5–5.0)
Albumin: 4.1 g/dL (ref 3.5–5.0)
Alkaline Phosphatase: 109 U/L (ref 38–126)
Alkaline Phosphatase: 209 U/L — ABNORMAL HIGH (ref 38–126)
Anion gap: 7 (ref 5–15)
Anion gap: 9 (ref 5–15)
BUN: 12 mg/dL (ref 8–23)
BUN: 18 mg/dL (ref 8–23)
CO2: 16 mmol/L — ABNORMAL LOW (ref 22–32)
CO2: 26 mmol/L (ref 22–32)
Calcium: 5.2 mg/dL — CL (ref 8.9–10.3)
Calcium: 9 mg/dL (ref 8.9–10.3)
Chloride: 120 mmol/L — ABNORMAL HIGH (ref 98–111)
Chloride: 101 mmol/L (ref 98–111)
Creatinine, Ser: 0.43 mg/dL — ABNORMAL LOW (ref 0.61–1.24)
Creatinine, Ser: 0.93 mg/dL (ref 0.61–1.24)
GFR calc Af Amer: >60 mL/min (ref >60)
GFR calc Af Amer: >60 mL/min (ref >60)
GFR calc non Af Amer: >60 mL/min (ref >60)
GFR calc non Af Amer: >60 mL/min (ref >60)
Glucose, Bld: 104 mg/dL — ABNORMAL HIGH (ref 70–99)
Glucose, Bld: 160 mg/dL — ABNORMAL HIGH (ref 70–99)
Potassium: 2.2 mmol/L — CL (ref 3.5–5.1)
Potassium: 3.8 mmol/L (ref 3.5–5.1)
Sodium: 143 mmol/L (ref 135–145)
Sodium: 136 mmol/L (ref 135–145)
Total Bilirubin: 1.4 mg/dL — ABNORMAL HIGH (ref 0.3–1.2)
Total Bilirubin: 2.8 mg/dL — ABNORMAL HIGH (ref 0.3–1.2)
Total Protein: 4 g/dL — ABNORMAL LOW (ref 6.5–8.1)
Total Protein: 7.7 g/dL (ref 6.5–8.1)

## 2019-10-03 LAB — PROTIME-INR
INR: 1.2 (ref 0.8–1.2)
Prothrombin Time: 15.2 seconds (ref 11.4–15.2)

## 2019-10-03 LAB — CBC WITH DIFFERENTIAL/PLATELET
Abs Immature Granulocytes: 0.05 10*3/uL (ref 0.00–0.07)
Basophils Absolute: 0.1 10*3/uL (ref 0.0–0.1)
Basophils Relative: 1 %
Eosinophils Absolute: 0 10*3/uL (ref 0.0–0.5)
Eosinophils Relative: 0 %
HCT: 42.6 % (ref 39.0–52.0)
Hemoglobin: 14.8 g/dL (ref 13.0–17.0)
Immature Granulocytes: 0 %
Lymphocytes Relative: 6 %
Lymphs Abs: 0.9 10*3/uL (ref 0.7–4.0)
MCH: 30.9 pg (ref 26.0–34.0)
MCHC: 34.7 g/dL (ref 30.0–36.0)
MCV: 88.9 fL (ref 80.0–100.0)
Monocytes Absolute: 0.5 10*3/uL (ref 0.1–1.0)
Monocytes Relative: 3 %
Neutro Abs: 13.6 10*3/uL — ABNORMAL HIGH (ref 1.7–7.7)
Neutrophils Relative %: 90 %
Platelets: 224 10*3/uL (ref 150–400)
RBC: 4.79 MIL/uL (ref 4.22–5.81)
RDW: 13.1 % (ref 11.5–15.5)
WBC: 15.2 10*3/uL — ABNORMAL HIGH (ref 4.0–10.5)
nRBC: 0 % (ref 0.0–0.2)

## 2019-10-03 LAB — LIPASE, BLOOD: Lipase: 16 U/L (ref 11–51)

## 2019-10-03 LAB — LACTIC ACID, PLASMA
Lactic Acid, Venous: 2.5 mmol/L (ref 0.5–1.9)
Lactic Acid, Venous: 2.1 mmol/L (ref 0.5–1.9)

## 2019-10-03 MED ORDER — HALOPERIDOL LACTATE 5 MG/ML IJ SOLN
2.5000 mg | Freq: Once | INTRAMUSCULAR | Status: AC
  Administered 2019-10-03: 2.5 mg via INTRAVENOUS
  Filled 2019-10-03: qty 1

## 2019-10-03 MED ORDER — ALUMINUM-MAGNESIUM-SIMETHICONE 200-200-20 MG/5ML PO SUSP: 30.0000 mL | Freq: Three times a day (TID) | ORAL | 0 refills | Status: DC

## 2019-10-03 MED ORDER — SODIUM CHLORIDE 0.9 % IV BOLUS: 1000.0000 mL | Freq: Once | INTRAVENOUS | Status: DC

## 2019-10-03 MED ORDER — ONDANSETRON HCL 4 MG/2ML IJ SOLN: 4.0000 mg | Freq: Once | INTRAMUSCULAR | Status: AC

## 2019-10-03 MED ORDER — ONDANSETRON 4 MG PO TBDP: 4.0000 mg | ORAL_TABLET | Freq: Three times a day (TID) | ORAL | 0 refills | Status: DC | PRN

## 2019-10-03 MED ORDER — MORPHINE SULFATE (PF) 4 MG/ML IV SOLN
INTRAVENOUS | Status: AC
  Filled 2019-10-03: qty 1

## 2019-10-03 MED ORDER — MORPHINE SULFATE (PF) 4 MG/ML IV SOLN
4.0000 mg | Freq: Once | INTRAVENOUS | Status: AC
  Administered 2019-10-03: 4 mg via INTRAVENOUS

## 2019-10-03 MED ORDER — ONDANSETRON HCL 4 MG/2ML IJ SOLN
INTRAMUSCULAR | Status: AC
  Administered 2019-10-03: 4 mg via INTRAVENOUS
  Filled 2019-10-03: qty 2

## 2019-10-03 MED ORDER — FAMOTIDINE 20 MG PO TABS: 20.0000 mg | ORAL_TABLET | Freq: Two times a day (BID) | ORAL | 0 refills | Status: DC

## 2019-10-03 MED ORDER — KETOROLAC TROMETHAMINE 30 MG/ML IJ SOLN
15.0000 mg | INTRAMUSCULAR | Status: AC
  Administered 2019-10-03: 15 mg via INTRAVENOUS
  Filled 2019-10-03: qty 1

## 2019-10-03 MED ORDER — PANTOPRAZOLE SODIUM 40 MG IV SOLR
40.0000 mg | Freq: Once | INTRAVENOUS | Status: AC
  Administered 2019-10-03: 40 mg via INTRAVENOUS
  Filled 2019-10-03: qty 40

## 2019-10-03 MED ORDER — SODIUM CHLORIDE 0.9 % IV BOLUS
1000.0000 mL | Freq: Once | INTRAVENOUS | Status: AC
  Administered 2019-10-03: 1000 mL via INTRAVENOUS

## 2019-10-03 NOTE — ED Triage Notes (Signed)
Pt presents to ED via POV. Immediately upon arrival to began vomiting bright red/brown emesis. Pt obviously uncomfortable in triage at this time. Unable to speak due to pain and emesis.

## 2019-10-03 NOTE — ED Notes (Signed)
Pt c/o of epigastric abd pain and back pain, N&V. States gallbladder removed in October. Takes blood thinners. Denies noticing blood in stool.

## 2019-10-03 NOTE — ED Notes (Signed)
Pink top resent to blood bank at this time

## 2019-10-03 NOTE — ED Notes (Signed)
Lab at bedside to redraw type and screen. MD also wants to repeat CMP to verify values previously resulted.

## 2019-10-03 NOTE — ED Provider Notes (Signed)
[facility] Emergency Department Provider Note  ____________________________________________  Time seen: Approximately 5:17 PM  I have reviewed the triage vital signs and the nursing notes.   HISTORY  Chief Complaint Back Pain and Abdominal Pain    HPI Ralph Melendez is a 73 y.o. male with a history of CAD GERD hypertension pancreatitis stomach ulcer TIA who comes the ED complaining of upper abdominal pain since yesterday, gradual onset, constant, no aggravating factors, alleviated by vomiting, nonradiating.  Denies black or bloody stool, denies hematemesis.  He attributes it to pain related to prior biliary disease, for which she had his gallbladder removed 2 months ago.  He does not think that it feels like prior pancreatitis.     Past Medical History:  Diagnosis Date  . Carotid artery disease ([facility])   . GERD (gastroesophageal reflux disease)   . HTN (hypertension)   . Pancreatitis   . Pancreatitis   . Ruptured disk 2011  . Stomach ulcer   . TIA (transient ischemic attack)      Patient Active Problem List   Diagnosis Date Noted  . Personal history of tobacco use, presenting hazards to health 11/25/2016  . Pancreatitis, acute 09/16/2015  . HTN (hypertension) 09/16/2015  . Carotid artery disease ([facility]) 09/16/2015  . GERD (gastroesophageal reflux disease) 09/16/2015  . Encounter for screening colonoscopy 07/25/2014     Past Surgical History:  Procedure Laterality Date  . BACK SURGERY  2011  . carotid  2008   cleaning  . COLONOSCOPY  2005  . NECK SURGERY    . STOMACH SURGERY  [phone],2012  . UPPER GI ENDOSCOPY     x 3     Prior to Admission medications   Medication Sig Start Date End Date Taking? Authorizing Provider  amLODipine (NORVASC) 5 MG tablet Take 5 mg by mouth daily.   Yes [provider]  clopidogrel (PLAVIX) 75 MG tablet Take 75 mg by mouth daily.   Yes [provider]  HYDROcodone-acetaminophen  (NORCO/VICODIN) 5-325 MG tablet Take 1 tablet by mouth every 6 (six) hours as needed for moderate pain.   Yes [provider]  aluminum-magnesium hydroxide-simethicone (MAALOX) 200-200-20 MG/5ML SUSP Take 30 mLs by mouth 4 (four) times daily -  before meals and at bedtime. 10/03/19   Sharman CheekStafford, Kerby Hockley, MD  famotidine (PEPCID) 20 MG tablet Take 1 tablet (20 mg total) by mouth 2 (two) times daily. 10/03/19   Sharman CheekStafford, Quill Grinder, MD  ondansetron (ZOFRAN ODT) 4 MG disintegrating tablet Take 1 tablet (4 mg total) by mouth every 8 (eight) hours as needed for nausea or vomiting. 10/03/19   Sharman CheekStafford, Tamey Wanek, MD     Allergies Contrast media [iodinated diagnostic agents] and Shrimp [shellfish allergy]   Family History  Problem Relation Age of Onset  . Breast cancer Mother     Social History Social History   Tobacco Use  . Smoking status: Former Smoker    Packs/day: 1.00    Years: 56.00    Pack years: 56.00    Types: Cigarettes    Quit date: 10/07/2014    Years since quitting: 4.9  . Smokeless tobacco: Never Used  Substance Use Topics  . Alcohol use: No    Alcohol/week: 0.0 standard drinks  . Drug use: No    Review of Systems  Constitutional:   No fever or chills.  ENT:   No sore throat. No rhinorrhea. Cardiovascular:   No chest pain or syncope. Respiratory:   No dyspnea or cough. Gastrointestinal:  Positive as above for abdominal pain and vomiting.  No constipation. Musculoskeletal:   Negative for focal pain or swelling All other systems reviewed and are negative except as documented above in ROS and HPI.  ____________________________________________   PHYSICAL EXAM:  VITAL SIGNS: ED Triage Vitals  Enc Vitals Group     BP 10/03/19 1610 130/90     Pulse Rate 10/03/19 1610 (!) 114     Resp 10/03/19 1610 (!) 28     Temp 10/03/19 1610 99 F (37.2 C)     Temp Source 10/03/19 1610 Oral     SpO2 10/03/19 1610 98 %     Weight 10/03/19 1613 186 lb (84.4 kg)     Height  10/03/19 1613 5\' 10"  (1.778 m)     Head Circumference --      Peak Flow --      Pain Score 10/03/19 1610 10     Pain Loc --      Pain Edu? --      Excl. in GC? --     Vital signs reviewed, nursing assessments reviewed.   Constitutional:   Alert and oriented. Non-toxic appearance. Eyes:   Conjunctivae are normal. EOMI. PERRL. ENT      Head:   Normocephalic and atraumatic.      Nose:   Wearing a mask.      Mouth/Throat:   Wearing a mask.      Neck:   No meningismus. Full ROM. Hematological/Lymphatic/Immunilogical:   No cervical lymphadenopathy. Cardiovascular:   Tachycardia heart rate 105. Symmetric bilateral radial and DP pulses.  No murmurs. Cap refill less than 2 seconds. Respiratory:   Tachypnea, normal work of breathing.  Breath sounds clear bilaterally without crackles or wheezes.  Symmetric air entry. Gastrointestinal:   Soft with left upper quadrant tenderness. Non distended. There is no CVA tenderness.  No rebound, rigidity, or guarding.  Musculoskeletal:   Normal range of motion in all extremities. No joint effusions.  No lower extremity tenderness.  No edema. Neurologic:   Normal speech and language.  Motor grossly intact. No acute focal neurologic deficits are appreciated.  Skin:    Skin is warm, dry and intact. No rash noted.  No petechiae, purpura, or bullae.  ____________________________________________    LABS (pertinent positives/negatives) (all labs ordered are listed, but only abnormal results are displayed) Labs Reviewed  COMPREHENSIVE METABOLIC PANEL - Abnormal; Notable for the following components:      Result Value   Potassium 2.2 (*)    Chloride 120 (*)    CO2 16 (*)    Glucose, Bld 104 (*)    Creatinine, Ser 0.43 (*)    Calcium 5.2 (*)    Total Protein 4.0 (*)    Albumin 2.1 (*)    AST 372 (*)    ALT 194 (*)    Total Bilirubin 1.4 (*)    All other components within normal limits  LACTIC ACID, PLASMA - Abnormal; Notable for the following  components:   Lactic Acid, Venous 2.5 (*)    All other components within normal limits  LACTIC ACID, PLASMA - Abnormal; Notable for the following components:   Lactic Acid, Venous 2.1 (*)    All other components within normal limits  CBC WITH DIFFERENTIAL/PLATELET - Abnormal; Notable for the following components:   WBC 15.2 (*)    Neutro Abs 13.6 (*)    All other components within normal limits  COMPREHENSIVE METABOLIC PANEL - Abnormal; Notable for the following components:  Glucose, Bld 160 (*)    AST 865 (*)    ALT 477 (*)    Alkaline Phosphatase 209 (*)    Total Bilirubin 2.8 (*)    All other components within normal limits  PROTIME-INR  LIPASE, BLOOD  POC OCCULT BLOOD, ED  TYPE AND SCREEN   ____________________________________________   EKG  Interpreted by me Sinus tachycardia rate 104, left axis, normal intervals.  Poor R wave progression.  Normal ST segments and T waves.  No acute ischemic changes.  ____________________________________________    RADIOLOGY  CT ABDOMEN PELVIS WO CONTRAST  Result Date: 10/03/2019 CLINICAL DATA:  Diverticulitis, nausea vomiting EXAM: CT ABDOMEN AND PELVIS WITHOUT CONTRAST TECHNIQUE: Multidetector CT imaging of the abdomen and pelvis was performed following the standard protocol without IV contrast. COMPARISON:  07/17/2019 FINDINGS: Lower chest: Incidental imaging of the lung bases is unremarkable. Hepatobiliary: Normal liver. Post cholecystectomy. Pancreas: Normal appearance of the pancreas. Spleen: Spleen is normal size without focal mass. Adrenals/Urinary Tract: Adrenal glands are normal. Kidneys with smooth contour and no signs of hydronephrosis. Large left renal cyst in the interpolar left kidney is unchanged. Stomach/Bowel: Signs of gastrojejunostomy, no signs of bowel dilation or evidence of obstruction. Nonspecific mildly distended loops of small bowel with stool like material. The appendix is normal. Signs of colonic diverticulosis.  No signs of diverticulitis. Vascular/Lymphatic: Calcific atherosclerotic changes. No signs of aneurysm. Limited assessment of vascular structures due to lack contrast. No adenopathy in the abdomen or pelvis. Reproductive: Prostate unremarkable by CT. Other: No signs of free air. No signs of free fluid. Musculoskeletal: No acute bone finding or destructive bone process. IMPRESSION: 1. Signs of gastrojejunostomy, no evidence of bowel obstruction. 2. Mildly distended mid small bowel loops without signs of obstruction, stool like material may represent delayed enteric transit, previous study show variable bowel distension in this area. 3. Signs of colonic diverticulosis without signs of diverticulitis. 4. Aortic atherosclerosis. Aortic Atherosclerosis (ICD10-I70.0). Electronically Signed   By: Zetta Bills M.D.   On: 10/03/2019 17:35    ____________________________________________   PROCEDURES Procedures  ____________________________________________  DIFFERENTIAL DIAGNOSIS   Perforated stomach ulcer, pancreatitis, bowel obstruction, diverticulitis.  CLINICAL IMPRESSION / ASSESSMENT AND PLAN / ED COURSE  Medications ordered in the ED: Medications  ondansetron (ZOFRAN) injection 4 mg (4 mg Intravenous Given 10/03/19 1621)  sodium chloride 0.9 % bolus 1,000 mL (0 mLs Intravenous Stopped 10/03/19 1902)  morphine 4 MG/ML injection 4 mg (4 mg Intravenous Given 10/03/19 1625)  haloperidol lactate (HALDOL) injection 2.5 mg (2.5 mg Intravenous Given 10/03/19 1758)  ketorolac (TORADOL) 30 MG/ML injection 15 mg (15 mg Intravenous Given 10/03/19 1758)  pantoprazole (PROTONIX) injection 40 mg (40 mg Intravenous Given 10/03/19 1758)    Pertinent labs & imaging results that were available during my care of the patient were reviewed by me and considered in my medical decision making (see chart for details).  Ralph Melendez was evaluated in Emergency Department on 10/03/2019 for the symptoms described in  the history of present illness. He was evaluated in the context of the global COVID-19 pandemic, which necessitated consideration that the patient might be at risk for infection with the SARS-CoV-2 virus that causes COVID-19. Institutional protocols and algorithms that pertain to the evaluation of patients at risk for COVID-19 are in a state of rapid change based on information released by regulatory bodies including the CDC and federal and state organizations. These policies and algorithms were followed during the patient's care in the ED.  Patient presents with abdominal pain and vomiting.  Most likely gastritis, SBO, pancreatitis, peptic ulcer disease.  Will give IV fluids, morphine 4 mg IV, Zofran 4 mg IV and obtain CT scan and labs.  Clinical Course as of Oct 03 2011  Tue Oct 03, 2019  2956 CMP repeated, shows actually a normal chemistry panel except for elevated LFTs.  On CT no evidence of choledocholithiasis or other biliary obstruction.  Possibly viral.  Patient adamantly denies any alcohol abuse or heavy Tylenol intake.   [PS]    Clinical Course User Index [PS] Sharman Cheek, MD     ----------------------------------------- 8:12 PM on 10/03/2019 -----------------------------------------  Patient feeling better, tolerating water.  Refuses to eat for p.o. trial but feels ready to go home.  Recommend follow-up with his primary care doctor and gastroenterology this week for further evaluation of elevated LFTs.  ____________________________________________   FINAL CLINICAL IMPRESSION(S) / ED DIAGNOSES    Final diagnoses:  Pain of upper abdomen  Acute gastritis without hemorrhage, unspecified gastritis type  Hepatitis     ED Discharge Orders         Ordered    aluminum-magnesium hydroxide-simethicone (MAALOX) 200-200-20 MG/5ML SUSP  3 times daily before meals & bedtime     10/03/19 2011    famotidine (PEPCID) 20 MG tablet  2 times daily     10/03/19 2011    ondansetron  (ZOFRAN ODT) 4 MG disintegrating tablet  Every 8 hours PRN     10/03/19 2011          Portions of this note were generated with dragon dictation software. Dictation errors may occur despite best attempts at proofreading.   Sharman Cheek, MD 10/03/19 2012

## 2019-10-03 NOTE — ED Notes (Signed)
Pt provided urinal.

## 2019-10-03 NOTE — Discharge Instructions (Addendum)
Your blood tests show abnormal liver function.  This may be related to a viral illness.  You should avoid alcohol or products containing Tylenol (acetaminophen) and follow up with your doctor and Gastroenterology.  In the meantime, take antacid medicine such as Pepcid (famotidine) and nausea medicine as needed to control your symptoms.  Results for orders placed or performed during the hospital encounter of 10/03/19  Comprehensive metabolic panel  Result Value Ref Range   Sodium 143 135 - 145 mmol/L   Potassium 2.2 (LL) 3.5 - 5.1 mmol/L   Chloride 120 (H) 98 - 111 mmol/L   CO2 16 (L) 22 - 32 mmol/L   Glucose, Bld 104 (H) 70 - 99 mg/dL   BUN 12 8 - 23 mg/dL   Creatinine, Ser 1.09 (L) 0.61 - 1.24 mg/dL   Calcium 5.2 (LL) 8.9 - 10.3 mg/dL   Total Protein 4.0 (L) 6.5 - 8.1 g/dL   Albumin 2.1 (L) 3.5 - 5.0 g/dL   AST 323 (H) 15 - 41 U/L   ALT 194 (H) 0 - 44 U/L   Alkaline Phosphatase 109 38 - 126 U/L   Total Bilirubin 1.4 (H) 0.3 - 1.2 mg/dL   GFR calc non Af Amer >60 >60 mL/min   GFR calc Af Amer >60 >60 mL/min   Anion gap 7 5 - 15  Protime-INR - (order if Patient is taking Coumadin / Warfarin)  Result Value Ref Range   Prothrombin Time 15.2 11.4 - 15.2 seconds   INR 1.2 0.8 - 1.2  Lactic acid, plasma  Result Value Ref Range   Lactic Acid, Venous 2.5 (HH) 0.5 - 1.9 mmol/L  Lactic acid, plasma  Result Value Ref Range   Lactic Acid, Venous 2.1 (HH) 0.5 - 1.9 mmol/L  CBC with Differential  Result Value Ref Range   WBC 15.2 (H) 4.0 - 10.5 K/uL   RBC 4.79 4.22 - 5.81 MIL/uL   Hemoglobin 14.8 13.0 - 17.0 g/dL   HCT 55.7 32.2 - 02.5 %   MCV 88.9 80.0 - 100.0 fL   MCH 30.9 26.0 - 34.0 pg   MCHC 34.7 30.0 - 36.0 g/dL   RDW 42.7 06.2 - 37.6 %   Platelets 224 150 - 400 K/uL   nRBC 0.0 0.0 - 0.2 %   Neutrophils Relative % 90 %   Neutro Abs 13.6 (H) 1.7 - 7.7 K/uL   Lymphocytes Relative 6 %   Lymphs Abs 0.9 0.7 - 4.0 K/uL   Monocytes Relative 3 %   Monocytes Absolute 0.5 0.1 - 1.0  K/uL   Eosinophils Relative 0 %   Eosinophils Absolute 0.0 0.0 - 0.5 K/uL   Basophils Relative 1 %   Basophils Absolute 0.1 0.0 - 0.1 K/uL   Immature Granulocytes 0 %   Abs Immature Granulocytes 0.05 0.00 - 0.07 K/uL  Lipase, blood  Result Value Ref Range   Lipase 16 11 - 51 U/L  Comprehensive metabolic panel  Result Value Ref Range   Sodium 136 135 - 145 mmol/L   Potassium 3.8 3.5 - 5.1 mmol/L   Chloride 101 98 - 111 mmol/L   CO2 26 22 - 32 mmol/L   Glucose, Bld 160 (H) 70 - 99 mg/dL   BUN 18 8 - 23 mg/dL   Creatinine, Ser 2.83 0.61 - 1.24 mg/dL   Calcium 9.0 8.9 - 15.1 mg/dL   Total Protein 7.7 6.5 - 8.1 g/dL   Albumin 4.1 3.5 - 5.0 g/dL   AST 761 (  H) 15 - 41 U/L   ALT 477 (H) 0 - 44 U/L   Alkaline Phosphatase 209 (H) 38 - 126 U/L   Total Bilirubin 2.8 (H) 0.3 - 1.2 mg/dL   GFR calc non Af Amer >60 >60 mL/min   GFR calc Af Amer >60 >60 mL/min   Anion gap 9 5 - 15  Type and screen  Result Value Ref Range   ABO/RH(D) A POS    Antibody Screen NEG    Sample Expiration      10/06/2019,2359 Performed at [facility], [address] [place], [place] [address]    CT ABDOMEN PELVIS WO CONTRAST  Result Date: 10/03/2019 CLINICAL DATA:  Diverticulitis, nausea vomiting EXAM: CT ABDOMEN AND PELVIS WITHOUT CONTRAST TECHNIQUE: Multidetector CT imaging of the abdomen and pelvis was performed following the standard protocol without IV contrast. COMPARISON:  07/17/2019 FINDINGS: Lower chest: Incidental imaging of the lung bases is unremarkable. Hepatobiliary: Normal liver. Post cholecystectomy. Pancreas: Normal appearance of the pancreas. Spleen: Spleen is normal size without focal mass. Adrenals/Urinary Tract: Adrenal glands are normal. Kidneys with smooth contour and no signs of hydronephrosis. Large left renal cyst in the interpolar left kidney is unchanged. Stomach/Bowel: Signs of gastrojejunostomy, no signs of bowel dilation or evidence of obstruction. Nonspecific mildly  distended loops of small bowel with stool like material. The appendix is normal. Signs of colonic diverticulosis. No signs of diverticulitis. Vascular/Lymphatic: Calcific atherosclerotic changes. No signs of aneurysm. Limited assessment of vascular structures due to lack contrast. No adenopathy in the abdomen or pelvis. Reproductive: Prostate unremarkable by CT. Other: No signs of free air. No signs of free fluid. Musculoskeletal: No acute bone finding or destructive bone process. IMPRESSION: 1. Signs of gastrojejunostomy, no evidence of bowel obstruction. 2. Mildly distended mid small bowel loops without signs of obstruction, stool like material may represent delayed enteric transit, previous study show variable bowel distension in this area. 3. Signs of colonic diverticulosis without signs of diverticulitis. 4. Aortic atherosclerosis. Aortic Atherosclerosis (ICD10-I70.0). Electronically Signed   By: Zetta Bills M.D.   On: 10/03/2019 17:35

## 2019-10-03 NOTE — ED Notes (Signed)
Date and time results received: 10/03/19 5:42 PM  Test: lactic, calcium, postassium Critical Value: 2.5, 5.2, 2.2- respectively  Name of Provider Notified: Joni Fears, MD

## 2019-10-03 NOTE — ED Notes (Signed)
Pt threw up scant amount of fluid x2 at this time, ED MD notified.

## 2019-10-03 NOTE — ED Notes (Signed)
Patient provided with water for fluid challenge, refused crackers adamantly. Tolerating water well

## 2019-12-23 ENCOUNTER — Emergency Department: Payer: Medicare HMO

## 2019-12-23 ENCOUNTER — Other Ambulatory Visit: Payer: Self-pay

## 2019-12-23 ENCOUNTER — Emergency Department
Admission: EM | Admit: 2019-12-23 | Discharge: 2019-12-24 | Disposition: A | Discharge status: Other Acute Inpatient Hospital | Payer: Medicare HMO | Attending: Emergency Medicine | Admitting: Emergency Medicine

## 2019-12-23 ENCOUNTER — Encounter: Payer: Self-pay | Admitting: Emergency Medicine

## 2019-12-23 DIAGNOSIS — R1110 Vomiting, unspecified: Secondary | ICD-10-CM | POA: Diagnosis not present

## 2019-12-23 DIAGNOSIS — I10 Essential (primary) hypertension: Secondary | ICD-10-CM | POA: Diagnosis not present

## 2019-12-23 DIAGNOSIS — Z79899 Other long term (current) drug therapy: Secondary | ICD-10-CM | POA: Insufficient documentation

## 2019-12-23 DIAGNOSIS — Z7902 Long term (current) use of antithrombotics/antiplatelets: Secondary | ICD-10-CM | POA: Insufficient documentation

## 2019-12-23 DIAGNOSIS — K8050 Calculus of bile duct without cholangitis or cholecystitis without obstruction: Secondary | ICD-10-CM | POA: Insufficient documentation

## 2019-12-23 DIAGNOSIS — Z87891 Personal history of nicotine dependence: Secondary | ICD-10-CM | POA: Insufficient documentation

## 2019-12-23 DIAGNOSIS — R109 Unspecified abdominal pain: Secondary | ICD-10-CM | POA: Diagnosis present

## 2019-12-23 LAB — COMPREHENSIVE METABOLIC PANEL
ALT: 264 U/L — ABNORMAL HIGH (ref 0–44)
AST: 353 U/L — ABNORMAL HIGH (ref 15–41)
Albumin: 4.3 g/dL (ref 3.5–5.0)
Alkaline Phosphatase: 301 U/L — ABNORMAL HIGH (ref 38–126)
Anion gap: 11 (ref 5–15)
BUN: 17 mg/dL (ref 8–23)
CO2: 24 mmol/L (ref 22–32)
Calcium: 9.5 mg/dL (ref 8.9–10.3)
Chloride: 100 mmol/L (ref 98–111)
Creatinine, Ser: 0.88 mg/dL (ref 0.61–1.24)
GFR calc Af Amer: >60 mL/min (ref >60)
GFR calc non Af Amer: >60 mL/min (ref >60)
Glucose, Bld: 170 mg/dL — ABNORMAL HIGH (ref 70–99)
Potassium: 3.8 mmol/L (ref 3.5–5.1)
Sodium: 135 mmol/L (ref 135–145)
Total Bilirubin: 3.5 mg/dL — ABNORMAL HIGH (ref 0.3–1.2)
Total Protein: 8.3 g/dL — ABNORMAL HIGH (ref 6.5–8.1)

## 2019-12-23 LAB — URINALYSIS, COMPLETE (UACMP) WITH MICROSCOPIC
Bacteria, UA: NONE SEEN
Glucose, UA: NEGATIVE mg/dL
Hgb urine dipstick: NEGATIVE
Ketones, ur: NEGATIVE mg/dL
Leukocytes,Ua: NEGATIVE
Nitrite: NEGATIVE
Protein, ur: NEGATIVE mg/dL
Specific Gravity, Urine: 1.013 (ref 1.005–1.030)
Squamous Epithelial / HPF: NONE SEEN (ref 0–5)
pH: 6 (ref 5.0–8.0)

## 2019-12-23 LAB — CBC WITH DIFFERENTIAL/PLATELET
Abs Immature Granulocytes: 0.04 10*3/uL (ref 0.00–0.07)
Basophils Absolute: 0.1 10*3/uL (ref 0.0–0.1)
Basophils Relative: 0 %
Eosinophils Absolute: 0 10*3/uL (ref 0.0–0.5)
Eosinophils Relative: 0 %
HCT: 42.9 % (ref 39.0–52.0)
Hemoglobin: 14.6 g/dL (ref 13.0–17.0)
Immature Granulocytes: 0 %
Lymphocytes Relative: 3 %
Lymphs Abs: 0.3 10*3/uL — ABNORMAL LOW (ref 0.7–4.0)
MCH: 30.9 pg (ref 26.0–34.0)
MCHC: 34 g/dL (ref 30.0–36.0)
MCV: 90.9 fL (ref 80.0–100.0)
Monocytes Absolute: 0.9 10*3/uL (ref 0.1–1.0)
Monocytes Relative: 7 %
Neutro Abs: 12.1 10*3/uL — ABNORMAL HIGH (ref 1.7–7.7)
Neutrophils Relative %: 90 %
Platelets: 206 10*3/uL (ref 150–400)
RBC: 4.72 MIL/uL (ref 4.22–5.81)
RDW: 12.4 % (ref 11.5–15.5)
WBC: 13.4 10*3/uL — ABNORMAL HIGH (ref 4.0–10.5)
nRBC: 0 % (ref 0.0–0.2)

## 2019-12-23 LAB — PROTIME-INR
INR: 1.1 (ref 0.8–1.2)
Prothrombin Time: 14.2 seconds (ref 11.4–15.2)

## 2019-12-23 LAB — TROPONIN I (HIGH SENSITIVITY)
Troponin I (High Sensitivity): <2 ng/L (ref <18)
Troponin I (High Sensitivity): 2 ng/L (ref <18)

## 2019-12-23 LAB — LACTIC ACID, PLASMA
Lactic Acid, Venous: 2.4 mmol/L (ref 0.5–1.9)
Lactic Acid, Venous: 1.8 mmol/L (ref 0.5–1.9)
Lactic Acid, Venous: 2.2 mmol/L (ref 0.5–1.9)

## 2019-12-23 LAB — LIPASE, BLOOD: Lipase: 21 U/L (ref 11–51)

## 2019-12-23 MED ORDER — FENTANYL CITRATE (PF) 100 MCG/2ML IJ SOLN
100.0000 ug | Freq: Once | INTRAMUSCULAR | Status: AC
  Administered 2019-12-23: 100 ug via INTRAVENOUS
  Filled 2019-12-23: qty 2

## 2019-12-23 MED ORDER — ONDANSETRON HCL 4 MG/2ML IJ SOLN
4.0000 mg | Freq: Once | INTRAMUSCULAR | Status: AC
  Administered 2019-12-23: 4 mg via INTRAVENOUS
  Filled 2019-12-23: qty 2

## 2019-12-23 MED ORDER — MORPHINE SULFATE (PF) 4 MG/ML IV SOLN
4.0000 mg | Freq: Once | INTRAVENOUS | Status: AC
  Administered 2019-12-23: 4 mg via INTRAVENOUS
  Filled 2019-12-23: qty 1

## 2019-12-23 MED ORDER — SODIUM CHLORIDE 0.9 % IV BOLUS
1000.0000 mL | Freq: Once | INTRAVENOUS | Status: AC
  Administered 2019-12-23: 1000 mL via INTRAVENOUS

## 2019-12-23 NOTE — ED Provider Notes (Signed)
[facility] Emergency Department Provider Note ____________________________________________   None    (approximate)  I have reviewed the triage vital signs and the nursing notes.   HISTORY  Chief Complaint Abdominal Pain and Emesis  HPI Ralph Melendez is a 74 y.o. male who presents to the ER for abdominal pain that started today at 11:00am. He has had multiple similar episodes in the past. He has a history of CAD, GERD, pancreatitis, stomach ulcer. He has had several episodes of vomiting as well. He last had these symptoms in December. He has had colonoscopy, and cholecystectomy yet continues to have recurrence of this pain.          Past Medical History:  Diagnosis Date  . Carotid artery disease ([place])   . GERD (gastroesophageal reflux disease)   . HTN (hypertension)   . Pancreatitis   . Pancreatitis   . Ruptured disk 2011  . Stomach ulcer   . TIA (transient ischemic attack)     Patient Active Problem List   Diagnosis Date Noted  . Personal history of tobacco use, presenting hazards to health 11/25/2016  . Pancreatitis, acute 09/16/2015  . HTN (hypertension) 09/16/2015  . Carotid artery disease ([place]) 09/16/2015  . GERD (gastroesophageal reflux disease) 09/16/2015  . Encounter for screening colonoscopy 07/25/2014    Past Surgical History:  Procedure Laterality Date  . BACK SURGERY  2011  . carotid  2008   cleaning  . COLONOSCOPY  2005  . NECK SURGERY    . STOMACH SURGERY  [phone]  . UPPER GI ENDOSCOPY     x 3    Prior to Admission medications   Medication Sig Start Date End Date Taking? Authorizing Provider  aluminum-magnesium hydroxide-simethicone (MAALOX) [phone] MG/5ML SUSP Take 30 mLs by mouth 4 (four) times daily -  before meals and at bedtime. 10/03/19   Carrie Mew, MD  amLODipine (NORVASC) 5 MG tablet Take 5 mg by mouth daily.    [provider]  clopidogrel (PLAVIX) 75 MG tablet Take 75 mg by mouth  daily.    [provider]  famotidine (PEPCID) 20 MG tablet Take 1 tablet (20 mg total) by mouth 2 (two) times daily. 10/03/19   Carrie Mew, MD  HYDROcodone-acetaminophen (NORCO/VICODIN) 5-325 MG tablet Take 1 tablet by mouth every 6 (six) hours as needed for moderate pain.    [provider]  ondansetron (ZOFRAN ODT) 4 MG disintegrating tablet Take 1 tablet (4 mg total) by mouth every 8 (eight) hours as needed for nausea or vomiting. 10/03/19   Carrie Mew, MD    Allergies Contrast media [iodinated diagnostic agents] and Shrimp [shellfish allergy]  Family History  Problem Relation Age of Onset  . Breast cancer Mother     Social History Social History   Tobacco Use  . Smoking status: Former Smoker    Packs/day: 1.00    Years: 56.00    Pack years: 56.00    Types: Cigarettes    Quit date: 10/07/2014    Years since quitting: 5.2  . Smokeless tobacco: Never Used  Substance Use Topics  . Alcohol use: No    Alcohol/week: 0.0 standard drinks  . Drug use: No    Review of Systems  Constitutional: No fever/chills Eyes: No visual changes. ENT: No sore throat. Cardiovascular: Denies chest pain. Respiratory: Denies shortness of breath. Gastrointestinal: Positive for abdominal pain with nausea and vomiting.  No diarrhea.  No constipation. Genitourinary: Negative for dysuria. Musculoskeletal: Negative for back pain.  Skin: Negative for rash. Neurological: Negative for headaches, focal weakness or numbness. ____________________________________________   PHYSICAL EXAM:  VITAL SIGNS: ED Triage Vitals  Enc Vitals Group     BP 12/23/19 1801 (!) 171/96     Pulse Rate 12/23/19 1801 (!) 109     Resp 12/23/19 1801 (!) 24     Temp 12/23/19 1801 98.6 F (37 C)     Temp Source 12/23/19 1801 Oral     SpO2 12/23/19 1801 100 %     Weight 12/23/19 1758 183 lb (83 kg)     Height 12/23/19 1758 5\' 10"  (1.778 m)     Head Circumference --      Peak Flow --       Pain Score 12/23/19 1758 10     Pain Loc --      Pain Edu? --      Excl. in GC? --     Constitutional: Alert and oriented. Uncomfortable appearing and in no acute distress. Eyes: Conjunctivae are normal. PERRL Head: Atraumatic. Nose: No congestion/rhinnorhea. Mouth/Throat: Mucous membranes are moist.  Oropharynx non-erythematous. Neck: No stridor.   Hematological/Lymphatic/Immunilogical: No cervical lymphadenopathy. Cardiovascular: Normal rate, regular rhythm. Grossly normal heart sounds.  Good peripheral circulation. Respiratory: Normal respiratory effort.  No retractions. Lungs CTAB. Gastrointestinal: Soft and diffusely tender. No distention. No abdominal bruits. Musculoskeletal: No lower extremity tenderness nor edema.  No joint effusions. Neurologic:  Normal speech and language. No gross focal neurologic deficits are appreciated. No gait instability. Skin:  Skin is warm, dry and intact. No rash noted. Psychiatric: Mood and affect are normal. Speech and behavior are normal.  ____________________________________________   LABS (all labs ordered are listed, but only abnormal results are displayed)  Labs Reviewed  COMPREHENSIVE METABOLIC PANEL - Abnormal; Notable for the following components:      Result Value   Glucose, Bld 170 (*)    Total Protein 8.3 (*)    AST 353 (*)    ALT 264 (*)    Alkaline Phosphatase 301 (*)    Total Bilirubin 3.5 (*)    All other components within normal limits  CBC WITH DIFFERENTIAL/PLATELET - Abnormal; Notable for the following components:   WBC 13.4 (*)    Neutro Abs 12.1 (*)    Lymphs Abs 0.3 (*)    All other components within normal limits  URINALYSIS, COMPLETE (UACMP) WITH MICROSCOPIC - Abnormal; Notable for the following components:   Color, Urine AMBER (*)    APPearance CLEAR (*)    Bilirubin Urine SMALL (*)    All other components within normal limits  LACTIC ACID, PLASMA - Abnormal; Notable for the following components:   Lactic  Acid, Venous 2.4 (*)    All other components within normal limits  LACTIC ACID, PLASMA - Abnormal; Notable for the following components:   Lactic Acid, Venous 2.2 (*)    All other components within normal limits  LIPASE, BLOOD  PROTIME-INR  LACTIC ACID, PLASMA  POC SARS CORONAVIRUS 2 AG -  ED  TROPONIN I (HIGH SENSITIVITY)  TROPONIN I (HIGH SENSITIVITY)   ____________________________________________  EKG  ED ECG REPORT I, Cedra Villalon, FNP-BC personally viewed and interpreted this ECG.   Date: 12/23/2019  EKG Time: 1808  Rate: 106  Rhythm: sinus tachycardia  Axis: no  Intervals:none  ST&T Change: no ST elevation  ____________________________________________  RADIOLOGY  ED MD interpretation:    CT concerning for choledocholithiasis I, Armani Gawlik, personally viewed and evaluated these images (plain radiographs) as part  of my medical decision making, as well as reviewing the written report by the radiologist.  Official radiology report(s): CT ABDOMEN PELVIS WO CONTRAST  Addendum Date: 12/23/2019   ADDENDUM REPORT: 12/23/2019 19:53 ADDENDUM: These results were called by telephone at the time of interpretation on 12/23/2019 at 7:53 pm to provider Esthefany Herrig , who verbally acknowledged these results. Electronically Signed   By: Kreg Shropshire M.D.   On: 12/23/2019 19:53   Result Date: 12/23/2019 CLINICAL DATA:  Acute abdominal pain at 1100 hours, greater than 15 episodes of emesis EXAM: CT ABDOMEN AND PELVIS WITHOUT CONTRAST TECHNIQUE: Multidetector CT imaging of the abdomen and pelvis was performed following the standard protocol without IV contrast. COMPARISON:  CT abdomen pelvis 10/03/2019 FINDINGS: Lower chest: Few sub 5 mm clustered nodules present in the left lung base with adjacent ground-glass. Could reflect acute infection or inflammation. Additional basilar areas of scarring and architectural distortion. Lung bases are otherwise clear. Normal heart size. No pericardial  effusion. Coronary artery atherosclerosis and aortic leaflet calcifications are noted. Hepatobiliary: Normal appearance of the liver. Patient appears to be post cholecystectomy. There is some adjacent hazy stranding about the extrahepatic biliary tree with few hyperattenuating foci noted within the distal cystic and distal common bile duct (5/43) compatible with partially calcified choledocholithiasis Pancreas: Pancreas is largely atrophy. No peripancreatic inflammation or ductal dilatation. Spleen: Punctate calcification in the posterior spleen, likely sequela of prior granulomatous disease. No worrisome splenic lesion. Adrenals/Urinary Tract: Normal adrenal glands. 5.8 cm fluid attenuation cyst in the interpolar left kidney is similar to prior. No worrisome or contour deforming renal lesion. Stable mild bilateral symmetric perinephric stranding, a nonspecific finding though may correlate with either age or decreased renal function. Kidneys are otherwise unremarkable, without renal calculi, suspicious lesion, or hydronephrosis. Bladder is unremarkable. 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 Gastrojejunostomy and jejunojejunostomy anastomoses are unremarkable. No gross abnormality of the largely collapsed biliopancreatic limb. Mild distal small bowel fecalization. No concerning thickening or dilatation. No evidence of mechanical obstruction. A normal appendix is visualized. No colonic dilatation or wall thickening. Scattered colonic diverticula most pronounced in the sigmoid colon. Segmental thickening of the sigmoid without pericolonic inflammatory features may reflect sequela of prior inflammation and is grossly similar to comparison study. Vascular/Lymphatic: Atherosclerotic plaque within the normal caliber aorta. No suspicious or enlarged lymph nodes in the included lymphatic chains. Reproductive: Prostatomegaly with indentation of the bladder base. Slightly high-riding  appearance of the testicles, may be physiologic. Other: No free fluid. No free air. No bowel containing hernias. Small bilateral fat containing inguinal hernias are noted. Musculoskeletal: No acute osseous abnormality or suspicious osseous lesion. Multilevel degenerative changes are present in the imaged portions of the spine. Additional degenerative changes noted in the hips and pelvis. IMPRESSION: 1. Few sub 5 mm clustered nodules in the left lung base with adjacent ground-glass features. Could reflect acute infection or inflammation. 2. Status post cholecystectomy. There is some adjacent hazy stranding about the extrahepatic biliary tree with few hyperattenuating foci noted within the distal cystic and common bile duct compatible with choledocholithiasis. Recommend correlation with exam findings and laboratory values to assess for clinical features of cholangitis. 3. 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 4. Colonic diverticulosis without evidence of acute diverticulitis. Mild segmental thickening is similar to prior and may reflect sequela of prior diverticular inflammation. Correlation with most recent colonoscopy is recommended. 5. Aortic Atherosclerosis (ICD10-I70.0). Electronically Signed: By: Kreg Shropshire M.D. On:  12/23/2019 19:47    ____________________________________________   PROCEDURES  Procedure(s) performed (including Critical Care):  Procedures  ____________________________________________   INITIAL IMPRESSION / ASSESSMENT AND PLAN     74 year old male presents to the emergency department for recurrent abdominal pain.  Patient states that today symptoms are similar to those in the past.  He was last here for the same in December.  Patient states that he has had the symptoms since having his gallbladder removed.  It looks like he had a cholecystectomy in September 2020 that was performed by Dr. Lily Peer at [facility].  Patient reports that he has been compliant in his follow-ups.  Pain started acutely today at approximately 11:00 AM.  Plan will be to perform CT.  Patient has an anaphylactic reaction to IV contrast therefore scan will be without.  DIFFERENTIAL DIAGNOSIS  Diverticulitis, diverticulosis, small bowel obstruction, choledocholithiasis, abdominal adhesions  ED COURSE  Fluids and fentanyl administered with significant improvement.  No further vomiting at this time.  ----------------------------------------- 8:24 PM on 12/23/2019 -----------------------------------------  Additional pain medications ordered.  Patient is aware of CT results showing choledocholithiasis.  We do not currently have can do an ERCP.  Patient would like to be transferred back to [facility] where he is familiar with the surgeons.  Currently awaiting return call from the transfer center.  ----------------------------------------- 9:14 PM on 12/23/2019 -----------------------------------------  Dr. Lowell Guitar at [facility] accepts the patient in transfer as a direct admission. Rinaldo Cloud at the transfer center currently making arrangements for placement and transportation. Patient made aware. He has spoken with family to notify them of plan.  ____________________________________________   FINAL CLINICAL IMPRESSION(S) / ED DIAGNOSES  Final diagnoses:  Choledocholithiasis     ED Discharge Orders    None       Ralph Melendez was evaluated in Emergency Department on 12/23/2019 for the symptoms described in the history of present illness. He was evaluated in the context of the global COVID-19 pandemic, which necessitated consideration that the patient might be at risk for infection with the SARS-CoV-2 virus that causes COVID-19. Institutional protocols and algorithms that pertain to the evaluation of patients at risk for COVID-19 are in a state of rapid change based on information released by  regulatory bodies including the CDC and federal and state organizations. These policies and algorithms were followed during the patient's care in the ED.   Note:  This document was prepared using Dragon voice recognition software and may include unintentional dictation errors.   Chinita Pester, FNP 12/24/19 1205    Phineas Semen, MD 12/24/19 [phone]

## 2019-12-23 NOTE — ED Triage Notes (Signed)
Pt here for mid abdominal pain that started acutely at 1100 today. >15 episodes of vomiting per pt.  Appears in significant pain.  No fever.  Reports multiple abdominal surgery. Last BM today.

## 2019-12-23 NOTE — ED Notes (Signed)
Pt given urinal. Pt understands we need urinal at this time.

## 2019-12-23 NOTE — ED Notes (Signed)
Report from [place], rn.

## 2019-12-23 NOTE — ED Notes (Signed)
Report to chris with [facility].

## 2019-12-24 NOTE — ED Notes (Signed)
Pt provided with telephone

## 2019-12-24 NOTE — ED Notes (Signed)
[facility] here to transport pt.

## 2020-01-01 ENCOUNTER — Other Ambulatory Visit: Payer: Self-pay

## 2020-01-01 ENCOUNTER — Encounter: Payer: Self-pay | Admitting: Emergency Medicine

## 2020-01-01 ENCOUNTER — Emergency Department
Admission: EM | Admit: 2020-01-01 | Discharge: 2020-01-02 | Disposition: A | Discharge status: Other Acute Inpatient Hospital | Payer: Medicare HMO | Attending: Emergency Medicine | Admitting: Emergency Medicine

## 2020-01-01 ENCOUNTER — Emergency Department: Payer: Medicare HMO

## 2020-01-01 DIAGNOSIS — I10 Essential (primary) hypertension: Secondary | ICD-10-CM | POA: Diagnosis not present

## 2020-01-01 DIAGNOSIS — Z20822 Contact with and (suspected) exposure to covid-19: Secondary | ICD-10-CM | POA: Insufficient documentation

## 2020-01-01 DIAGNOSIS — Z79899 Other long term (current) drug therapy: Secondary | ICD-10-CM | POA: Diagnosis not present

## 2020-01-01 DIAGNOSIS — R1011 Right upper quadrant pain: Secondary | ICD-10-CM | POA: Diagnosis present

## 2020-01-01 DIAGNOSIS — Z9889 Other specified postprocedural states: Secondary | ICD-10-CM | POA: Insufficient documentation

## 2020-01-01 DIAGNOSIS — A419 Sepsis, unspecified organism: Secondary | ICD-10-CM | POA: Diagnosis not present

## 2020-01-01 DIAGNOSIS — R109 Unspecified abdominal pain: Secondary | ICD-10-CM

## 2020-01-01 LAB — COMPREHENSIVE METABOLIC PANEL
ALT: 34 U/L (ref 0–44)
AST: 25 U/L (ref 15–41)
Albumin: 3.4 g/dL — ABNORMAL LOW (ref 3.5–5.0)
Alkaline Phosphatase: 104 U/L (ref 38–126)
Anion gap: 6 (ref 5–15)
BUN: 15 mg/dL (ref 8–23)
CO2: 30 mmol/L (ref 22–32)
Calcium: 8.6 mg/dL — ABNORMAL LOW (ref 8.9–10.3)
Chloride: 99 mmol/L (ref 98–111)
Creatinine, Ser: 0.72 mg/dL (ref 0.61–1.24)
GFR calc Af Amer: >60 mL/min (ref >60)
GFR calc non Af Amer: >60 mL/min (ref >60)
Glucose, Bld: 176 mg/dL — ABNORMAL HIGH (ref 70–99)
Potassium: 4.1 mmol/L (ref 3.5–5.1)
Sodium: 135 mmol/L (ref 135–145)
Total Bilirubin: 1.9 mg/dL — ABNORMAL HIGH (ref 0.3–1.2)
Total Protein: 7 g/dL (ref 6.5–8.1)

## 2020-01-01 LAB — URINALYSIS, ROUTINE W REFLEX MICROSCOPIC
Bilirubin Urine: NEGATIVE
Glucose, UA: NEGATIVE mg/dL
Hgb urine dipstick: NEGATIVE
Ketones, ur: NEGATIVE mg/dL
Leukocytes,Ua: NEGATIVE
Nitrite: NEGATIVE
Protein, ur: NEGATIVE mg/dL
Specific Gravity, Urine: 1.015 (ref 1.005–1.030)
pH: 7 (ref 5.0–8.0)

## 2020-01-01 LAB — CBC WITH DIFFERENTIAL/PLATELET
Abs Immature Granulocytes: 0.07 10*3/uL (ref 0.00–0.07)
Basophils Absolute: 0 10*3/uL (ref 0.0–0.1)
Basophils Relative: 0 %
Eosinophils Absolute: 0.1 10*3/uL (ref 0.0–0.5)
Eosinophils Relative: 1 %
HCT: 41.2 % (ref 39.0–52.0)
Hemoglobin: 14 g/dL (ref 13.0–17.0)
Immature Granulocytes: 0 %
Lymphocytes Relative: 6 %
Lymphs Abs: 0.9 10*3/uL (ref 0.7–4.0)
MCH: 30.8 pg (ref 26.0–34.0)
MCHC: 34 g/dL (ref 30.0–36.0)
MCV: 90.5 fL (ref 80.0–100.0)
Monocytes Absolute: 0.7 10*3/uL (ref 0.1–1.0)
Monocytes Relative: 4 %
Neutro Abs: 14.4 10*3/uL — ABNORMAL HIGH (ref 1.7–7.7)
Neutrophils Relative %: 89 %
Platelets: 382 10*3/uL (ref 150–400)
RBC: 4.55 MIL/uL (ref 4.22–5.81)
RDW: 13.2 % (ref 11.5–15.5)
WBC: 16.1 10*3/uL — ABNORMAL HIGH (ref 4.0–10.5)
nRBC: 0 % (ref 0.0–0.2)

## 2020-01-01 LAB — PROCALCITONIN: Procalcitonin: 0.16 ng/mL

## 2020-01-01 LAB — TYPE AND SCREEN
ABO/RH(D): A POS
Antibody Screen: NEGATIVE

## 2020-01-01 LAB — PROTIME-INR
INR: 1.1 (ref 0.8–1.2)
Prothrombin Time: 14.2 seconds (ref 11.4–15.2)

## 2020-01-01 LAB — LACTIC ACID, PLASMA
Lactic Acid, Venous: 2.8 mmol/L (ref 0.5–1.9)
Lactic Acid, Venous: 2.7 mmol/L (ref 0.5–1.9)

## 2020-01-01 LAB — TROPONIN I (HIGH SENSITIVITY)
Troponin I (High Sensitivity): 7 ng/L (ref <18)
Troponin I (High Sensitivity): 8 ng/L (ref <18)

## 2020-01-01 LAB — LIPASE, BLOOD: Lipase: 29 U/L (ref 11–51)

## 2020-01-01 LAB — BRAIN NATRIURETIC PEPTIDE: B Natriuretic Peptide: 81 pg/mL (ref 0.0–100.0)

## 2020-01-01 LAB — APTT: aPTT: 29 seconds (ref 24–36)

## 2020-01-01 LAB — POC SARS CORONAVIRUS 2 AG: SARS Coronavirus 2 Ag: NEGATIVE

## 2020-01-01 MED ORDER — SODIUM CHLORIDE 0.9 % IV SOLN
2.0000 g | Freq: Once | INTRAVENOUS | Status: AC
  Administered 2020-01-01: 2 g via INTRAVENOUS
  Filled 2020-01-01: qty 2

## 2020-01-01 MED ORDER — SODIUM CHLORIDE 0.9 % IV BOLUS
1000.0000 mL | Freq: Once | INTRAVENOUS | Status: AC
  Administered 2020-01-01: 1000 mL via INTRAVENOUS

## 2020-01-01 MED ORDER — HYDROMORPHONE HCL 1 MG/ML IJ SOLN
1.0000 mg | Freq: Once | INTRAMUSCULAR | Status: AC
  Administered 2020-01-01: 0.5 mg via INTRAVENOUS
  Filled 2020-01-01: qty 1

## 2020-01-01 MED ORDER — METRONIDAZOLE IN NACL 5-0.79 MG/ML-% IV SOLN
500.0000 mg | Freq: Once | INTRAVENOUS | Status: AC
  Administered 2020-01-01: 500 mg via INTRAVENOUS
  Filled 2020-01-01: qty 100

## 2020-01-01 MED ORDER — VANCOMYCIN HCL IN DEXTROSE 1-5 GM/200ML-% IV SOLN
1000.0000 mg | Freq: Once | INTRAVENOUS | Status: AC
  Administered 2020-01-01: 1000 mg via INTRAVENOUS
  Filled 2020-01-01: qty 200

## 2020-01-01 MED ORDER — HYDROMORPHONE HCL 1 MG/ML IJ SOLN
0.5000 mg | Freq: Once | INTRAMUSCULAR | Status: AC
  Administered 2020-01-01: 0.5 mg via INTRAVENOUS
  Filled 2020-01-01: qty 1

## 2020-01-01 MED ORDER — ONDANSETRON HCL 4 MG/2ML IJ SOLN
4.0000 mg | Freq: Once | INTRAMUSCULAR | Status: AC
  Administered 2020-01-01: 4 mg via INTRAVENOUS
  Filled 2020-01-01: qty 2

## 2020-01-01 NOTE — ED Triage Notes (Signed)
Pt from home via [facility] for continuing abd pain. Pt had a drain placed for gallstones on Tuesday an has since had continuing pain starting on Wednesday. Pt st his drain stopped working on Friday.   Pt c/o SHOB and abd pain at this time. EMS gave pt of fentanyl; pain was a 10/10 and has decreased to a 4/10. EDP at bedside

## 2020-01-01 NOTE — ED Notes (Signed)
Lab called and informed this RN that the light green blood specimen had hemolyzed.   A re-drawn light green top sample along with the 1st set of blood cultures and a tall pink top for type and screen was sent to the lab at this time 20:20p for further analysis

## 2020-01-01 NOTE — Progress Notes (Signed)
PHARMACY -  BRIEF ANTIBIOTIC NOTE   Pharmacy has received consult(s) for Vancomycin and Cefepime from an ED provider.  The patient's profile has been reviewed for ht/wt/allergies/indication/available labs.    One time order(s) placed for Vancomycin 1g IV and Cefepime 2g IV x 1 dose  Further antibiotics/pharmacy consults should be ordered by admitting physician if indicated.                       Thank you, Bettey Costa 01/01/2020  9:53 PM

## 2020-01-01 NOTE — ED Provider Notes (Signed)
[facility] Emergency Department Provider Note  ____________________________________________   First MD Initiated Contact with Patient 01/01/20 1930     (approximate)  I have reviewed the triage vital signs and the nursing notes.   HISTORY  Chief Complaint Abdominal Pain    HPI Ralph Melendez is a 74 y.o. male with complex abdominal surgeries who comes in with concerns for abdominal pain.  Patient was recently admitted at [facility] due to choledocholithiasis.  Patient was transferred over there for ERCP given patient gets all of his care there.  Patient initially had his gallbladder removed in October 2020.  When they admitted him recently they placed a biliary drain.  Patient states that he was told that he could disconnect the drain if he was not having pain.  He had disconnected it for a few days but then on Sunday he started having some pain and reconnected the drain had a lot of output from it.  However last night he had increasing severe pain in his right upper quadrant that was constant, nothing made it better, nothing made it worse.  Stated he felt a little short of breath as well with it.  He states that this pain was different than the pain he had previously given his prior pain was more in the middle of his abdomen and this pain is more in his right upper quadrant.  He denies any fevers.          Past Medical History:  Diagnosis Date   Carotid artery disease (HCC)    GERD (gastroesophageal reflux disease)    HTN (hypertension)    Pancreatitis    Pancreatitis    Ruptured disk 2011   Stomach ulcer    TIA (transient ischemic attack)     Patient Active Problem List   Diagnosis Date Noted   Personal history of tobacco use, presenting hazards to health 11/25/2016   Pancreatitis, acute 09/16/2015   HTN (hypertension) 09/16/2015   Carotid artery disease (HCC) 09/16/2015   GERD (gastroesophageal reflux disease) 09/16/2015     Encounter for screening colonoscopy 07/25/2014    Past Surgical History:  Procedure Laterality Date   BACK SURGERY  2011   carotid  2008   cleaning   COLONOSCOPY  2005   NECK SURGERY     STOMACH SURGERY  [phone]   UPPER GI ENDOSCOPY     x 3    Prior to Admission medications   Medication Sig Start Date End Date Taking? Authorizing Provider  aluminum-magnesium hydroxide-simethicone (MAALOX) 200-200-20 MG/5ML SUSP Take 30 mLs by mouth 4 (four) times daily -  before meals and at bedtime. 10/03/19   Sharman Cheek, MD  amLODipine (NORVASC) 5 MG tablet Take 5 mg by mouth daily.    [provider]  clopidogrel (PLAVIX) 75 MG tablet Take 75 mg by mouth daily.    [provider]  famotidine (PEPCID) 20 MG tablet Take 1 tablet (20 mg total) by mouth 2 (two) times daily. 10/03/19   Sharman Cheek, MD  HYDROcodone-acetaminophen (NORCO/VICODIN) 5-325 MG tablet Take 1 tablet by mouth every 6 (six) hours as needed for moderate pain.    [provider]  ondansetron (ZOFRAN ODT) 4 MG disintegrating tablet Take 1 tablet (4 mg total) by mouth every 8 (eight) hours as needed for nausea or vomiting. 10/03/19   Sharman Cheek, MD    Allergies Contrast media [iodinated diagnostic agents] and Shrimp [shellfish allergy]  Family History  Problem Relation Age of  Onset   Breast cancer Mother     Social History Social History   Tobacco Use   Smoking status: Former Smoker    Packs/day: 1.00    Years: 56.00    Pack years: 56.00    Types: Cigarettes    Quit date: 10/07/2014    Years since quitting: 5.2   Smokeless tobacco: Never Used  Substance Use Topics   Alcohol use: No    Alcohol/week: 0.0 standard drinks   Drug use: No      Review of Systems Constitutional: No fever/chills Eyes: No visual changes. ENT: No sore throat. Cardiovascular: Denies chest pain. Respiratory: Positive shortness of breath Gastrointestinal positive abdominal  pain.  No diarrhea.  No constipation. Genitourinary: Negative for dysuria. Musculoskeletal: Negative for back pain. Skin: Negative for rash. Neurological: Negative for headaches, focal weakness or numbness. All other ROS negative ____________________________________________   PHYSICAL EXAM:  VITAL SIGNS: ED Triage Vitals [01/01/20 1927]  Enc Vitals Group     BP      Pulse      Resp      Temp      Temp src      SpO2 94 %     Weight      Height      Head Circumference      Peak Flow      Pain Score      Pain Loc      Pain Edu?      Excl. in [place]?     Constitutional: Alert and oriented. Well appearing and in no acute distress. Eyes: Conjunctivae are normal. EOMI. Head: Atraumatic. Nose: No congestion/rhinnorhea. Mouth/Throat: Mucous membranes are moist.   Neck: No stridor. Trachea Midline. FROM Cardiovascular: Tachycardic, regular rhythm. Grossly normal heart sounds.  Good peripheral circulation. Respiratory: Normal respiratory effort.  No retractions. Lungs CTAB. Gastrointestinal: Drain in place without any significant erythema around it.  Patient is tender in his right upper quadrant Musculoskeletal: No lower extremity tenderness nor edema.  No joint effusions. Neurologic:  Normal speech and language. No gross focal neurologic deficits are appreciated.  Skin:  Skin is warm, dry and intact. No rash noted. Psychiatric: Mood and affect are normal. Speech and behavior are normal. GU: Deferred   ____________________________________________   LABS (all labs ordered are listed, but only abnormal results are displayed)  Labs Reviewed  RESPIRATORY PANEL BY RT PCR (FLU A&B, COVID)  CBC WITH DIFFERENTIAL/PLATELET  COMPREHENSIVE METABOLIC PANEL  LACTIC ACID, PLASMA  LACTIC ACID, PLASMA  PROTIME-INR  APTT  BRAIN NATRIURETIC PEPTIDE  LIPASE, BLOOD  URINALYSIS, ROUTINE W REFLEX MICROSCOPIC  TYPE AND SCREEN  TROPONIN I (HIGH SENSITIVITY)    ____________________________________________   ED ECG REPORT I, Vanessa Oakwood, the attending physician, personally viewed and interpreted this ECG.  EKG is sinus tachycardia rate of 109, T wave inversions in lead I and aVL similar to prior EKG, no ST elevation, no T wave inversions, normal intervals ____________________________________________  RADIOLOGY Robert Bellow, personally viewed and evaluated these images (plain radiographs) as part of my medical decision making, as well as reviewing the written report by the radiologist.  ED MD interpretation:  No pna   Official radiology report(s): CT ABDOMEN PELVIS WO CONTRAST  Result Date: 01/01/2020 CLINICAL DATA:  73 year old male with abdominal pain. EXAM: CT ABDOMEN AND PELVIS WITHOUT CONTRAST TECHNIQUE: Multidetector CT imaging of the abdomen and pelvis was performed following the standard protocol without IV contrast. COMPARISON:  CT abdomen pelvis dated 12/23/2019.  FINDINGS: Evaluation of this exam is limited in the absence of intravenous contrast. Evaluation is also limited due to streak artifact caused by dense contrast within the colon. Lower chest: Trace right pleural effusion with associated partial compressive atelectasis of the right lung base. There are bibasilar linear atelectasis/scarring. There is multi vessel coronary vascular calcification. There is hypoattenuation of the cardiac blood pool suggestive of a degree of anemia. Clinical correlation is recommended. No intra-abdominal free air or free fluid. Hepatobiliary: The liver is grossly unremarkable. No intrahepatic biliary ductal dilatation. Cholecystectomy. There has been interval placement of a percutaneous transhepatic pigtail drainage catheter with tip in the subhepatic area to the right of the uncinate process of the pancreas. No drainable fluid collection noted. Evaluation of the retained stone in the central common bile duct is limited due to severe motion artifact. No  biliary ductal dilatation. Mild thickening of the right lateral liver capsule may represent a small subcapsular hematoma. Pancreas: There is fat stranding adjacent to the head and uncinate process of the pancreas which may be reactive or represent acute pancreatitis. Correlation with pancreatic enzymes recommended. Spleen: Normal in size without focal abnormality. Adrenals/Urinary Tract: The adrenal glands are unremarkable. There is a 6.5 cm left renal interpolar cyst. There is no hydronephrosis or nephrolithiasis on either side. The visualized ureters and urinary bladder appear unremarkable. 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 No evidence of obstruction or active inflammation. Dense contrast from prior study noted throughout the colon. There is severe sigmoid diverticulosis. Minimal perisigmoid haziness (series 5, image 40) is likely chronic. Mild acute diverticulitis is less likely but not excluded. Clinical correlation is recommended. The appendix is normal. Vascular/Lymphatic: There is advanced aortoiliac atherosclerotic disease. The IVC is unremarkable. No portal venous gas. No adenopathy. Reproductive: Mildly enlarged prostate gland measuring 5 cm in transverse axial diameter. The seminal vesicles are symmetric. Other: Midline vertical anterior abdominal wall incisional scar. Musculoskeletal: Osteopenia with degenerative changes of the spine. No acute osseous pathology. IMPRESSION: 1. Interval placement of a percutaneous transhepatic pigtail drainage catheter with tip in the subhepatic area to the right of the uncinate process of the pancreas. No drainable fluid collection noted. No biliary ductal dilatation. 2. Inflammatory changes adjacent to the head and uncinate process of the pancreas may be reactive or represent acute pancreatitis. Correlation with pancreatic enzymes recommended. 3. Severe sigmoid diverticulosis. Minimal perisigmoid haziness is likely chronic. Mild acute  diverticulitis is less likely but not excluded. Clinical correlation is recommended. No bowel obstruction. Normal appendix. 4. Trace right pleural effusion with partial compressive atelectasis of the right lung base. 5. Aortic Atherosclerosis (ICD10-I70.0). Electronically Signed   By: Elgie CollardArash  Radparvar M.D.   On: 01/01/2020 21:25   DG Chest Portable 1 View  Result Date: 01/01/2020 CLINICAL DATA:  Shortness of breath EXAM: PORTABLE CHEST 1 VIEW COMPARISON:  07/25/2013, CT chest 06/14/2019 FINDINGS: Low lung volumes. Patchy atelectasis or minimal infiltrate left base. Cardiomediastinal silhouette within normal limits. Aortic atherosclerosis. IMPRESSION: Low lung volumes, favor atelectasis over small pneumonia at the left base. Electronically Signed   By: Jasmine PangKim  Fujinaga M.D.   On: 01/01/2020 20:04    ____________________________________________   PROCEDURES  Procedure(s) performed (including Critical Care):  .Critical Care Performed by: Concha SeFunke, [place] E, MD Authorized by: Concha SeFunke, Amela Handley E, MD   Critical care provider statement:    Critical care time (minutes):  35   Critical care was necessary to treat or prevent imminent or life-threatening deterioration of the following conditions:  Sepsis  Critical care was time spent personally by me on the following activities:  Discussions with consultants, evaluation of patient's response to treatment, examination of patient, ordering and performing treatments and interventions, ordering and review of laboratory studies, ordering and review of radiographic studies, pulse oximetry, re-evaluation of patient's condition, obtaining history from patient or surrogate and review of old charts     ____________________________________________   INITIAL IMPRESSION / ASSESSMENT AND PLAN / ED COURSE  Damontre Millea Kepner was evaluated in Emergency Department on 01/01/2020 for the symptoms described in the history of present illness. He was evaluated in the context of the  global COVID-19 pandemic, which necessitated consideration that the patient might be at risk for infection with the SARS-CoV-2 virus that causes COVID-19. Institutional protocols and algorithms that pertain to the evaluation of patients at risk for COVID-19 are in a state of rapid change based on information released by regulatory bodies including the CDC and federal and state organizations. These policies and algorithms were followed during the patient's care in the ED.    Patient is a 74 year old status cholecystectomy and now biliary drain due to retained stone who comes in with increasing right upper quadrant pain.  We will start with a CT scan to evaluate for abscess, drain placement, perforation, obstruction.  Patient has a anaphylaxis to contrast so we will have to do without contrast.  Also consider PE given patient is postop and not on any blood thinners.  Unfortunate unable to do contrasted CT.  Will start off with troponins, BNP, lactate, and chest x-ray.  At this time I do not think D-dimer would be beneficial given it will be most likely be elevated due to his recent surgery.  Patient's blood count is elevated at 16 and his heart rate is elevated in the 100s.  Patient meet sirs criteria.  Will get blood cultures.  Lactate slightly elevated at 2.8 so we will give some fluids.  CT scan shows placement of the transhepatic pigtail catheter without any drainable fluid collection.  He does have a little bit of inflammation on his pancreas but his lipase was normal.  There is also concern for maybe some mild acute diverticulitis but patient not really having any pain in his left lower quadrant.  There was concerns of some trace right pleural effusion with atelectasis of the right lung base.  Patient was placed on oxygen but is just because he was breathing hard.  Patient was never hypoxic.  Unable to get CTA due to contrast allergy causing anaphylaxis.  Discussed with our V/Q team and it would be over  3 hours.  Patient states that if he needs to be admitted he would like to go back over to [facility].  Given his pain is right where the drain is I think would be best to transfer him over there and they can continue further work-up for PE.  This time he is not truly hypoxic and not unstable and I do think that there are some risk of starting heparin dose given he is recently postop so I think he is stable enough to be transferred off the heparin at this time.  Is still not clear if this pain is secondary to the drain and a biliary issue versus from something else in his lungs.  D/w Dr. Thana Ates and they have accepted patient over for transfer.  They stated they can continue to work patient up for PE and will evaluate his surgical drain given his significant pain.  ____________________________________________   FINAL CLINICAL IMPRESSION(S) / ED DIAGNOSES   Final diagnoses:  Abdominal pain, unspecified abdominal location  Sepsis, due to unspecified organism, unspecified whether acute organ dysfunction present [facility])      MEDICATIONS GIVEN DURING THIS VISIT:  Medications  metroNIDAZOLE (FLAGYL) IVPB 500 mg (500 mg Intravenous New Bag/Given 01/01/20 2221)  vancomycin (VANCOCIN) IVPB 1000 mg/200 mL premix (1,000 mg Intravenous New Bag/Given 01/01/20 2256)  sodium chloride 0.9 % bolus 1,000 mL (0 mLs Intravenous Stopped 01/01/20 2108)  HYDROmorphone (DILAUDID) injection 0.5 mg (0.5 mg Intravenous Given 01/01/20 1947)  ondansetron (ZOFRAN) injection 4 mg (4 mg Intravenous Given 01/01/20 1947)  HYDROmorphone (DILAUDID) injection 1 mg (0.5 mg Intravenous Given 01/01/20 2148)  ceFEPIme (MAXIPIME) 2 g in sodium chloride 0.9 % 100 mL IVPB (0 g Intravenous Stopped 01/01/20 2257)     ED Discharge Orders    None       Note:  This document was prepared using Dragon voice recognition software and may include unintentional dictation errors.   Concha Se, MD 01/01/20 [phone]

## 2020-01-01 NOTE — ED Notes (Signed)
CT PELVIS AND ABD WO CONTRAST - has not been completed

## 2020-01-01 NOTE — ED Notes (Signed)
2nd set of blood cultures collected from left forearm by Gwynneth Munson, RN.

## 2020-01-01 NOTE — ED Notes (Signed)
Date and time results received: 01/01/20 8:02 PM  Test: Lactic  Critical Value: 2.8  Name of Provider Notified: Dr. Fuller Plan

## 2020-01-01 NOTE — Progress Notes (Signed)
CODE SEPSIS - PHARMACY COMMUNICATION  **Broad Spectrum Antibiotics should be administered within 1 hour of Sepsis diagnosis**  Time Code Sepsis Called/Page Received: 2143  Antibiotics Ordered: Vancomycin,Cefepime,Flagyl  Time of 1st antibiotic administration: 2220  Additional action taken by pharmacy: none  If necessary, Name of Provider/Nurse Contacted: n/a    Bettey Costa ,PharmD Clinical Pharmacist  01/01/2020  10:23 PM

## 2020-01-02 ENCOUNTER — Telehealth: Payer: Self-pay | Admitting: Medical Oncology

## 2020-01-02 LAB — BLOOD CULTURE ID PANEL (REFLEXED)

## 2020-01-02 MED ORDER — HYDROMORPHONE HCL 1 MG/ML IJ SOLN
0.5000 mg | Freq: Once | INTRAMUSCULAR | Status: AC
  Administered 2020-01-02: 0.5 mg via INTRAVENOUS
  Filled 2020-01-02: qty 1

## 2020-01-02 NOTE — Progress Notes (Signed)
Touched basis with RN Sarah about repeat LA, RN confirmed that MD did not want to repeat LA

## 2020-01-02 NOTE — Telephone Encounter (Signed)
This RN received a call from lab about positive blood cultures, it was noted that pt was transferred to [facility]. This RN called the [facility] access line- pt was present in [address]. I spoke with Charge RN and received fax number- [phone]. Blood culture report was faxed electronically to charge RN [address] at [place].

## 2020-01-02 NOTE — ED Notes (Signed)
Signature Pad Malfunction  Pt signed a hard copy of the Transfer Consent and a copy of said document was sent with [facility] [employer], ground transport) and the other copy was placed in medical records for pick up. Scanned by Laurene Footman

## 2020-01-02 NOTE — ED Notes (Addendum)
Pt's gallstone drain was emptied, amount collected was 

## 2020-01-03 MED ORDER — GENERIC EXTERNAL MEDICATION
Status: DC
Start: ? — End: 2020-01-03

## 2020-01-03 MED ORDER — ONDANSETRON HCL 4 MG/2ML IJ SOLN
4.00 | INTRAMUSCULAR | Status: DC
Start: ? — End: 2020-01-03

## 2020-01-03 MED ORDER — CALCIUM CARBONATE 1250 (500 CA) MG PO CHEW
500.00 | CHEWABLE_TABLET | ORAL | Status: DC
Start: ? — End: 2020-01-03

## 2020-01-03 MED ORDER — GENERIC EXTERNAL MEDICATION
1.00 | Status: DC
Start: 2020-01-05 — End: 2020-01-03

## 2020-01-03 MED ORDER — AMLODIPINE BESYLATE 5 MG PO TABS
5.00 | ORAL_TABLET | ORAL | Status: DC
Start: 2020-01-05 — End: 2020-01-03

## 2020-01-03 MED ORDER — METRONIDAZOLE IN NACL 5-0.79 MG/ML-% IV SOLN
500.00 | INTRAVENOUS | Status: DC
Start: 2020-01-04 — End: 2020-01-03

## 2020-01-03 MED ORDER — LACTATED RINGERS IV SOLN
INTRAVENOUS | Status: DC
Start: ? — End: 2020-01-03

## 2020-01-03 MED ORDER — ACETAMINOPHEN 325 MG PO TABS
650.00 | ORAL_TABLET | ORAL | Status: DC
Start: 2020-01-04 — End: 2020-01-03

## 2020-01-03 MED ORDER — OXYCODONE HCL 5 MG PO TABS
5.00 | ORAL_TABLET | ORAL | Status: DC
Start: ? — End: 2020-01-03

## 2020-01-03 MED ORDER — ENOXAPARIN SODIUM 40 MG/0.4ML ~~LOC~~ SOLN
40.00 | SUBCUTANEOUS | Status: DC
Start: 2020-01-05 — End: 2020-01-03

## 2020-01-04 LAB — CULTURE, BLOOD (ROUTINE X 2): Special Requests: ADEQUATE

## 2020-01-04 MED ORDER — POTASSIUM CHLORIDE CRYS ER 20 MEQ PO TBCR
40.00 | EXTENDED_RELEASE_TABLET | ORAL | Status: DC
Start: 2020-01-04 — End: 2020-01-04

## 2020-03-05 ENCOUNTER — Ambulatory Visit: Payer: Medicare HMO | Attending: Internal Medicine

## 2020-03-05 DIAGNOSIS — Z23 Encounter for immunization: Secondary | ICD-10-CM

## 2020-03-05 NOTE — Progress Notes (Signed)
   Covid-19 Vaccination Clinic  Name:  MITCHAEL LUCKEY    MRN: [id number] DOB: 1946/09/27  03/05/2020  Mr. Kiester was observed post Covid-19 immunization for 30 minutes based on pre-vaccination screening without incident. He was provided with Vaccine Information Sheet and instruction to access the V-Safe system.   Mr. Gharibian was instructed to call 911 with any severe reactions post vaccine: Marland Kitchen Difficulty breathing  . Swelling of face and throat  . A fast heartbeat  . A bad rash all over body  . Dizziness and weakness   Immunizations Administered    Name Date Dose VIS Date Route   Pfizer COVID-19 Vaccine 03/05/2020  1:23 PM 0.3 mL 12/13/2018 Intramuscular   Manufacturer: [employer], [employer]   Lot: [id number]   NDC: [address]-9

## 2020-03-26 ENCOUNTER — Ambulatory Visit: Payer: Medicare HMO

## 2020-03-26 ENCOUNTER — Ambulatory Visit: Payer: Medicare HMO | Attending: Internal Medicine

## 2020-03-26 DIAGNOSIS — Z23 Encounter for immunization: Secondary | ICD-10-CM

## 2020-03-26 NOTE — Progress Notes (Signed)
   Covid-19 Vaccination Clinic  Name:  Ralph Melendez    MRN: [id number] DOB: [birth year]  03/26/2020  [name] was observed post Covid-19 immunization for 30 minutes based on pre-vaccination screening without incident. He was provided with Vaccine Information Sheet and instruction to access the V-Safe system.   [name] was instructed to call 911 with any severe reactions post vaccine: Marland Kitchen Difficulty breathing  . Swelling of face and throat  . A fast heartbeat  . A bad rash all over body  . Dizziness and weakness   Immunizations Administered    Name Date Dose VIS Date Route   Pfizer COVID-19 Vaccine 03/26/2020 10:26 AM 0.3 mL 12/13/2018 Intramuscular   Manufacturer: [employer], [employer]   Lot: [id number]   NDC: [address]-4

## 2020-05-26 ENCOUNTER — Telehealth: Payer: Self-pay

## 2020-05-26 NOTE — Telephone Encounter (Signed)
Contacted patient to schedule annual lung CT screening scan.  Patient agreeable and scan has been scheduled for Sept 2 at 4:30.  Patient's current smoking status is non smoking for 6 years.  His insurance has not changed since last scan.  Patient is aware of where to go for scan.

## 2020-05-27 ENCOUNTER — Other Ambulatory Visit: Payer: Self-pay | Admitting: *Deleted

## 2020-05-27 DIAGNOSIS — Z87891 Personal history of nicotine dependence: Secondary | ICD-10-CM

## 2020-05-27 DIAGNOSIS — Z122 Encounter for screening for malignant neoplasm of respiratory organs: Secondary | ICD-10-CM

## 2020-06-20 ENCOUNTER — Ambulatory Visit
Admission: RE | Admit: 2020-06-20 | Discharge: 2020-06-20 | Disposition: A | Discharge status: Home / Self Care | Payer: Medicare HMO | Source: Ambulatory Visit | Attending: Nurse Practitioner | Admitting: Nurse Practitioner

## 2020-06-20 ENCOUNTER — Other Ambulatory Visit: Payer: Self-pay

## 2020-06-20 DIAGNOSIS — Z87891 Personal history of nicotine dependence: Secondary | ICD-10-CM | POA: Insufficient documentation

## 2020-06-20 DIAGNOSIS — Z122 Encounter for screening for malignant neoplasm of respiratory organs: Secondary | ICD-10-CM | POA: Diagnosis present

## 2020-06-27 ENCOUNTER — Telehealth: Payer: Self-pay | Admitting: *Deleted

## 2020-06-27 NOTE — Telephone Encounter (Signed)
Notified patient of LDCT lung cancer screening program results with recommendation for 3 month follow up imaging. Also notified of incidental findings noted below and is encouraged to discuss further with PCP who will receive a copy of this note and/or the CT report. Patient verbalizes understanding.   IMPRESSION: 1. Lung-RADS 4A, suspicious. New indistinct 7.8 mm medial right lower lobe nodular opacity with surrounding ground-glass opacity, potentially inflammatory. Follow up low-dose chest CT without contrast in 3 months (please use the following order, "CT CHEST LCS NODULE FOLLOW-UP W/O CM") is recommended. 2. Three-vessel coronary atherosclerosis. 3. Aortic Atherosclerosis (ICD10-I70.0) and Emphysema (ICD10-J43.9).

## 2020-08-02 DIAGNOSIS — K8071 Calculus of gallbladder and bile duct without cholecystitis with obstruction: Secondary | ICD-10-CM | POA: Insufficient documentation

## 2020-09-09 ENCOUNTER — Other Ambulatory Visit: Payer: Self-pay | Admitting: *Deleted

## 2020-09-09 DIAGNOSIS — R918 Other nonspecific abnormal finding of lung field: Secondary | ICD-10-CM

## 2020-09-09 DIAGNOSIS — Z87891 Personal history of nicotine dependence: Secondary | ICD-10-CM

## 2020-09-09 NOTE — Progress Notes (Signed)
Contacted and scheduled for LCS nodule follow up. Patient is a former smoker, quit 10/07/14, 56 pack year history.

## 2020-09-26 ENCOUNTER — Ambulatory Visit
Admission: RE | Admit: 2020-09-26 | Discharge: 2020-09-26 | Disposition: A | Discharge status: Home / Self Care | Payer: Medicare HMO | Source: Ambulatory Visit | Attending: Nurse Practitioner | Admitting: Nurse Practitioner

## 2020-09-26 ENCOUNTER — Other Ambulatory Visit: Payer: Self-pay

## 2020-09-26 DIAGNOSIS — R918 Other nonspecific abnormal finding of lung field: Secondary | ICD-10-CM

## 2020-09-26 DIAGNOSIS — Z87891 Personal history of nicotine dependence: Secondary | ICD-10-CM

## 2020-09-27 ENCOUNTER — Ambulatory Visit
Admission: RE | Admit: 2020-09-27 | Discharge: 2020-09-27 | Disposition: A | Discharge status: Home / Self Care | Payer: Medicare HMO | Source: Ambulatory Visit | Attending: Nurse Practitioner | Admitting: Nurse Practitioner

## 2020-09-27 DIAGNOSIS — Z87891 Personal history of nicotine dependence: Secondary | ICD-10-CM | POA: Diagnosis not present

## 2020-09-27 DIAGNOSIS — R918 Other nonspecific abnormal finding of lung field: Secondary | ICD-10-CM | POA: Diagnosis present

## 2020-10-02 ENCOUNTER — Telehealth: Payer: Self-pay | Admitting: *Deleted

## 2020-10-02 NOTE — Telephone Encounter (Signed)
Notified patient of LDCT lung cancer screening program results with recommendation for 12 month follow up imaging. Also notified of incidental findings noted below and is encouraged to discuss further with PCP who will receive a copy of this note and/or the CT report. Patient verbalizes understanding.    IMPRESSION: 1. Lung-RADS 2, benign appearance or behavior. Continue annual screening with low-dose chest CT without contrast in 12 months. 2. Emphysema (ICD10-J43.9) and Aortic Atherosclerosis (ICD10-170.0)   

## 2021-08-13 ENCOUNTER — Other Ambulatory Visit: Payer: Self-pay | Admitting: *Deleted

## 2021-08-13 DIAGNOSIS — Z87891 Personal history of nicotine dependence: Secondary | ICD-10-CM

## 2021-09-29 ENCOUNTER — Other Ambulatory Visit: Payer: Self-pay

## 2021-09-29 ENCOUNTER — Ambulatory Visit
Admission: RE | Admit: 2021-09-29 | Discharge: 2021-09-29 | Disposition: A | Discharge status: Home / Self Care | Payer: Medicare HMO | Source: Ambulatory Visit | Attending: Acute Care | Admitting: Acute Care

## 2021-09-29 DIAGNOSIS — Z87891 Personal history of nicotine dependence: Secondary | ICD-10-CM | POA: Diagnosis present

## 2021-10-09 ENCOUNTER — Other Ambulatory Visit: Payer: Self-pay | Admitting: Acute Care

## 2021-10-09 DIAGNOSIS — Z87891 Personal history of nicotine dependence: Secondary | ICD-10-CM

## 2022-09-29 ENCOUNTER — Ambulatory Visit
Admission: RE | Admit: 2022-09-29 | Discharge: 2022-09-29 | Disposition: A | Discharge status: Home / Self Care | Payer: Medicare HMO | Source: Ambulatory Visit | Attending: Acute Care | Admitting: Acute Care

## 2022-09-29 DIAGNOSIS — I700 Atherosclerosis of aorta: Secondary | ICD-10-CM | POA: Insufficient documentation

## 2022-09-29 DIAGNOSIS — J849 Interstitial pulmonary disease, unspecified: Secondary | ICD-10-CM | POA: Diagnosis not present

## 2022-09-29 DIAGNOSIS — I2510 Atherosclerotic heart disease of native coronary artery without angina pectoris: Secondary | ICD-10-CM | POA: Insufficient documentation

## 2022-09-29 DIAGNOSIS — Z122 Encounter for screening for malignant neoplasm of respiratory organs: Secondary | ICD-10-CM | POA: Insufficient documentation

## 2022-09-29 DIAGNOSIS — Z87891 Personal history of nicotine dependence: Secondary | ICD-10-CM | POA: Insufficient documentation

## 2022-10-07 ENCOUNTER — Telehealth: Payer: Self-pay | Admitting: Acute Care

## 2022-10-07 DIAGNOSIS — Z87891 Personal history of nicotine dependence: Secondary | ICD-10-CM

## 2022-10-07 DIAGNOSIS — Z122 Encounter for screening for malignant neoplasm of respiratory organs: Secondary | ICD-10-CM

## 2022-10-07 DIAGNOSIS — J849 Interstitial pulmonary disease, unspecified: Secondary | ICD-10-CM

## 2022-10-07 NOTE — Telephone Encounter (Signed)
I have called the patient with results of his low-dose screening CT.  I explained that from a lung cancer's perspective his scan was read as a lung RADS 2, he just needs an annual screening follow-up in 12 months. 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  I explained that there was notation of interstitial lung disease that is progressive from previous exams and may be due to fibrotic nonspecific interstitial pneumonitis or fibrotic hypersensitivity pneumonitis.  UIP is considered less likely. I explained that this can be a progressive disease and that we should get him established with a physician who specializes in management.  I told him that we have 2 physicians in this practice his expertise is interstitial lung disease.  And I told him I felt it would be best for [place] to refer him to see 1 of these 2 physicians to get him established  so that we can continue to track him for progression of disease. Patient states that he has no dyspnea at current time.  I explained that this is good but that we do still need to follow for additional progression of disease. Patient is in agreement with a referral to pulmonary Dr. Marchelle Gearing or Dr. Isaiah Serge to get established to allow continuous follow-up of the incidental finding of interstitial lung disease. Denise please place 33-month annual follow-up screening CT and fax results to PCP.  Let them know plan will be for a referral to pulmonary to follow interstitial lung disease.  Thank you so much

## 2022-10-08 NOTE — Telephone Encounter (Signed)
CT results faxed to PCP with follow up plans included. Order placed for 12 mth follow up Lung screening CT.

## 2022-11-10 ENCOUNTER — Ambulatory Visit: Payer: Medicare HMO | Admitting: Pulmonary Disease

## 2022-11-10 ENCOUNTER — Encounter: Payer: Self-pay | Admitting: Pulmonary Disease

## 2022-11-10 VITALS — BP 126/84 | HR 119 | Temp 97.9°F | Ht 70.0 in | Wt 201.6 lb

## 2022-11-10 DIAGNOSIS — J849 Interstitial pulmonary disease, unspecified: Secondary | ICD-10-CM | POA: Diagnosis not present

## 2022-11-10 LAB — COMPREHENSIVE METABOLIC PANEL
ALT: 21 U/L (ref 0–53)
AST: 23 U/L (ref 0–37)
Albumin: 4.6 g/dL (ref 3.5–5.2)
Alkaline Phosphatase: 67 U/L (ref 39–117)
BUN: 18 mg/dL (ref 6–23)
CO2: 28 mEq/L (ref 19–32)
Calcium: 10.3 mg/dL (ref 8.4–10.5)
Chloride: 99 mEq/L (ref 96–112)
Creatinine, Ser: 0.95 mg/dL (ref 0.40–1.50)
GFR: 76.94 mL/min (ref >60.00)
Glucose, Bld: 108 mg/dL — ABNORMAL HIGH (ref 70–99)
Potassium: 4.2 mEq/L (ref 3.5–5.1)
Sodium: 136 mEq/L (ref 135–145)
Total Bilirubin: 0.6 mg/dL (ref 0.2–1.2)
Total Protein: 8.1 g/dL (ref 6.0–8.3)

## 2022-11-10 NOTE — Progress Notes (Addendum)
Ralph Melendez    [id number]    [birth year]  Primary Care Physician:Tate, Leona Carry, MD  Referring Physician: Albina Billet, MD [address]   [place],  [place] [address]  Chief complaint: Consult for interstitial lung disease  HPI: 77 y.o. who  has a past medical history of Carotid artery disease ([place]), GERD (gastroesophageal reflux disease), HTN (hypertension), Pancreatitis, Pancreatitis, Ruptured disk (2011), Stomach ulcer, and TIA (transient ischemic attack).   Referred for evaluation of abnormal screening CT chest He feels well with no issues.  Denies any dyspnea, cough, sputum production, congestion History notable for OSA.  He stopped using CPAP about 15 years ago.  Pets: No pets Occupation: Chief Strategy Officer, Nurse, adult Exposures: No mold, hot tub, Jacuzzi.  No feather pillows or comforters ILD questionnaire 11/10/2022-negative for any exposures. Smoking history: 55-pack-year smoker.  Quit in 2015 Travel history: No significant travel history Relevant family history: No family history of lung disease   Outpatient Encounter Medications as of 11/10/2022  Medication Sig   aluminum-magnesium hydroxide-simethicone (MAALOX) 200-200-20 MG/5ML SUSP Take 30 mLs by mouth 4 (four) times daily -  before meals and at bedtime.   amLODipine (NORVASC) 5 MG tablet Take 5 mg by mouth daily.   clopidogrel (PLAVIX) 75 MG tablet Take 75 mg by mouth daily.   famotidine (PEPCID) 20 MG tablet Take 1 tablet (20 mg total) by mouth 2 (two) times daily.   HYDROcodone-acetaminophen (NORCO/VICODIN) 5-325 MG tablet Take 1 tablet by mouth every 6 (six) hours as needed for moderate pain.   ondansetron (ZOFRAN ODT) 4 MG disintegrating tablet Take 1 tablet (4 mg total) by mouth every 8 (eight) hours as needed for nausea or vomiting.   ursodiol (ACTIGALL) 250 MG tablet Take 250 mg by mouth 2 (two) times daily.   No facility-administered encounter medications on file as of  11/10/2022.    Allergies as of 11/10/2022 - Review Complete 11/10/2022  Allergen Reaction Noted   Contrast media [iodinated contrast media] Anaphylaxis 07/24/2014   Shrimp [shellfish allergy] Anaphylaxis 09/16/2015    Past Medical History:  Diagnosis Date   Carotid artery disease (HCC)    GERD (gastroesophageal reflux disease)    HTN (hypertension)    Pancreatitis    Pancreatitis    Ruptured disk 2011   Stomach ulcer    TIA (transient ischemic attack)     Past Surgical History:  Procedure Laterality Date   BACK SURGERY  2011   carotid  2008   cleaning   COLONOSCOPY  2005   NECK SURGERY     STOMACH SURGERY  [phone]   UPPER GI ENDOSCOPY     x 3    Family History  Problem Relation Age of Onset   Breast cancer Mother     Social History   Socioeconomic History   Marital status: Married    Spouse name: Not on file   Number of children: Not on file   Years of education: Not on file   Highest education level: Not on file  Occupational History   Not on file  Tobacco Use   Smoking status: Former    Packs/day: 1.00    Years: 56.00    Total pack years: 56.00    Types: Cigarettes    Quit date: 10/07/2014    Years since quitting: 8.0   Smokeless tobacco: Never  Substance and Sexual Activity   Alcohol use: No    Alcohol/week: 0.0  standard drinks of alcohol   Drug use: No   Sexual activity: Not on file  Other Topics Concern   Not on file  Social History Narrative   Not on file   Social Determinants of Health   Financial Resource Strain: Not on file  Food Insecurity: Not on file  Transportation Needs: Not on file  Physical Activity: Not on file  Stress: Not on file  Social Connections: Not on file  Intimate Partner Violence: Not on file    Review of systems: Review of Systems  Constitutional: Negative for fever and chills.  HENT: Negative.   Eyes: Negative for blurred vision.  Respiratory: as per HPI  Cardiovascular: Negative for chest pain and  palpitations.  Gastrointestinal: Negative for vomiting, diarrhea, blood per rectum. Genitourinary: Negative for dysuria, urgency, frequency and hematuria.  Musculoskeletal: Negative for myalgias, back pain and joint pain.  Skin: Negative for itching and rash.  Neurological: Negative for dizziness, tremors, focal weakness, seizures and loss of consciousness.  Endo/Heme/Allergies: Negative for environmental allergies.  Psychiatric/Behavioral: Negative for depression, suicidal ideas and hallucinations.  All other systems reviewed and are negative.  Physical Exam: Blood pressure 126/84, pulse (!) 119, temperature 97.9 F (36.6 C), temperature source Oral, height 5\' 10"  (1.778 m), weight 201 lb 9.6 oz (91.4 kg), SpO2 98 %. Gen:      No acute distress HEENT:  EOMI, sclera anicteric Neck:     No masses; no thyromegaly Lungs:    Clear to auscultation bilaterally; normal respiratory effort CV:         Regular rate and rhythm; no murmurs Abd:      + bowel sounds; soft, non-tender; no palpable masses, no distension Ext:    No edema; adequate peripheral perfusion Skin:      Warm and dry; no rash Neuro: alert and oriented x 3 Psych: normal mood and affect  Data Reviewed: Imaging: Screening CT 09/29/2022-fibrotic interstitial lung disease with groundglass, traction bronchiectasis, subpleural reticular densities.  Alternate pattern.  I have reviewed the images personally.  PFTs:  Labs:  Assessment:  Assessment for interstitial lung disease His CT scan shows fibrotic lung disease which has been progressive since at least 2018.  Pattern is nonspecific alternate with what looks like upper lobe predominance. He does not have any significant exposures.  He does have arthritis but no other symptoms of connective tissue disease Will check some baseline serologies for connective tissue disease, high-res CT and PFTs  He is really not interested in an aggressive workup or treatment but agreed to at  least have these baseline test performed.  Plan/Recommendations: CTD serologies High-res CT, PFTs  Marshell Garfinkel MD [facility] 11/10/2022, 1:08 PM  CC: Albina Billet, MD

## 2022-11-10 NOTE — Patient Instructions (Signed)
Will get some labs today for further evaluation of the changes in your lungs Schedule high-res CT and PFTs at [facility] Return to clinic in 1 to 2 months with in person or video visit for review and plan for next steps

## 2022-11-11 ENCOUNTER — Telehealth: Payer: Self-pay | Admitting: Pulmonary Disease

## 2022-11-11 NOTE — Telephone Encounter (Signed)
PT daughter calling. She was in appt w/Dad but did not understand what he "has". Not sure if it is fibrosis ir ILD. Please call to advise her.  Mat Carne @ [phone]

## 2022-11-12 LAB — ANA,IFA RA DIAG PNL W/RFLX TIT/PATN
Anti Nuclear Antibody (ANA): POSITIVE — AB
Cyclic Citrullin Peptide Ab: 19 UNITS
Rheumatoid fact SerPl-aCnc: <14 IU/mL (ref <14)

## 2022-11-12 LAB — ANTI-DNA ANTIBODY, DOUBLE-STRANDED: ds DNA Ab: <1 IU/mL

## 2022-11-12 LAB — ANTI-NUCLEAR AB-TITER (ANA TITER): ANA Titer 1: 1:80 {titer} — ABNORMAL HIGH

## 2022-11-12 NOTE — Telephone Encounter (Signed)
Called and spoke with pt's daughter letting her know the diagnosis that Dr. Vaughan Browner has stated for pt and she verbalized understanding. Nothing further needed.

## 2022-11-18 ENCOUNTER — Ambulatory Visit
Admission: RE | Admit: 2022-11-18 | Discharge: 2022-11-18 | Disposition: A | Discharge status: Home / Self Care | Payer: Medicare HMO | Source: Ambulatory Visit | Attending: Pulmonary Disease | Admitting: Pulmonary Disease

## 2022-11-18 DIAGNOSIS — J849 Interstitial pulmonary disease, unspecified: Secondary | ICD-10-CM | POA: Diagnosis present

## 2022-11-19 ENCOUNTER — Telehealth: Payer: Self-pay | Admitting: Pulmonary Disease

## 2022-11-19 DIAGNOSIS — J849 Interstitial pulmonary disease, unspecified: Secondary | ICD-10-CM

## 2022-11-19 NOTE — Telephone Encounter (Signed)
Called and spoke to Gab from [facility] Radiology for CT:  IMPRESSION: 1. Spectrum of findings compatible with mild to moderate fibrotic interstitial lung disease without frank honeycombing, without clear apicobasilar gradient and without appreciable interval change since recent 09/29/2022 screening chest CT. Differential is broad and includes NSIP, chronic hypersensitivity pneumonitis or UIP. Findings are indeterminate for UIP per consensus guidelines: Diagnosis of Idiopathic Pulmonary Fibrosis: An Official ATS/ERS/JRS/ALAT Clinical Practice Guideline. [place], Iss 5, [phone], Jun 19 2017. 2. New indistinct patchy focus of peribronchovascular ground-glass opacity and reticulation in the left lower lobe, nonspecific, favor mild infectious or inflammatory bronchiolitis. Suggest attention on follow-up noncontrast chest CT in 3-6 months in this high risk patient. 3. Previously described scattered small solid right pulmonary nodules are all stable since 09/29/2022 screening chest CT. 4. Three-vessel coronary atherosclerosis. 5.  Aortic Atherosclerosis (ICD10-I70.0).

## 2022-11-20 NOTE — Telephone Encounter (Signed)
Order is placed for a new HRCT scan to be done in August 2024. Since Dr Vaughan Browner already spoke to patient no need for clinic to call again. Nothing further needed

## 2022-11-20 NOTE — Telephone Encounter (Signed)
I called and discussed with the patient. He does not have cough, worsening congestion, or fevers/chills so we will avoid sending in antibiotics  Please order high res CT follow up in 6 months.

## 2023-01-07 ENCOUNTER — Ambulatory Visit: Payer: Medicare HMO | Attending: Pulmonary Disease

## 2023-01-07 DIAGNOSIS — J849 Interstitial pulmonary disease, unspecified: Secondary | ICD-10-CM | POA: Diagnosis not present

## 2023-01-07 DIAGNOSIS — Z87891 Personal history of nicotine dependence: Secondary | ICD-10-CM | POA: Insufficient documentation

## 2023-01-07 MED ORDER — ALBUTEROL SULFATE (2.5 MG/3ML) 0.083% IN NEBU
2.5000 mg | INHALATION_SOLUTION | Freq: Once | RESPIRATORY_TRACT | Status: AC
  Administered 2023-01-07: 2.5 mg via RESPIRATORY_TRACT
  Filled 2023-01-07: qty 3

## 2023-01-08 ENCOUNTER — Ambulatory Visit: Payer: Medicare HMO | Admitting: Pulmonary Disease

## 2023-01-08 ENCOUNTER — Encounter: Payer: Self-pay | Admitting: Pulmonary Disease

## 2023-01-08 VITALS — BP 122/62 | HR 54 | Temp 97.7°F | Ht 70.0 in | Wt 202.6 lb

## 2023-01-08 DIAGNOSIS — Z87891 Personal history of nicotine dependence: Secondary | ICD-10-CM

## 2023-01-08 DIAGNOSIS — J849 Interstitial pulmonary disease, unspecified: Secondary | ICD-10-CM | POA: Diagnosis not present

## 2023-01-08 NOTE — Progress Notes (Signed)
Ralph Melendez    [id number]    Aug 12, 1946  Primary Care Physician:Tate, Leona Carry, MD  Referring Physician: Albina Billet, MD [address]   [place],  [place] [address]  Chief complaint: Follow-up for interstitial lung disease  HPI: 77 y.o. who  has a past medical history of Carotid artery disease ([place]), GERD (gastroesophageal reflux disease), HTN (hypertension), Pancreatitis, Pancreatitis, Ruptured disk (2011), Stomach ulcer, and TIA (transient ischemic attack).   Referred for evaluation of abnormal screening CT chest He feels well with no issues.  Denies any dyspnea, cough, sputum production, congestion History notable for OSA.  He stopped using CPAP about 15 years ago.  Pets: No pets Occupation: Chief Strategy Officer, Nurse, adult Exposures: No mold, hot tub, Jacuzzi.  No feather pillows or comforters ILD questionnaire 11/10/2022-negative for any exposures. Smoking history: 55-pack-year smoker.  Quit in 2015 Travel history: No significant travel history Relevant family history: No family history of lung disease  Interim history: Here for review of CT and PFTs.  States that breathing is stable with no change   Outpatient Encounter Medications as of 01/08/2023  Medication Sig   amLODipine (NORVASC) 5 MG tablet Take 5 mg by mouth daily.   clopidogrel (PLAVIX) 75 MG tablet Take 75 mg by mouth daily.   HYDROcodone-acetaminophen (NORCO/VICODIN) 5-325 MG tablet Take 1 tablet by mouth every 6 (six) hours as needed for moderate pain.   ursodiol (ACTIGALL) 250 MG tablet Take 250 mg by mouth 2 (two) times daily.   [DISCONTINUED] aluminum-magnesium hydroxide-simethicone (MAALOX) I037812 MG/5ML SUSP Take 30 mLs by mouth 4 (four) times daily -  before meals and at bedtime.   [DISCONTINUED] famotidine (PEPCID) 20 MG tablet Take 1 tablet (20 mg total) by mouth 2 (two) times daily.   [DISCONTINUED] ondansetron (ZOFRAN ODT) 4 MG disintegrating tablet Take 1 tablet (4  mg total) by mouth every 8 (eight) hours as needed for nausea or vomiting.   No facility-administered encounter medications on file as of 01/08/2023.    Physical Exam: Blood pressure 122/62, pulse (!) 54, temperature 97.7 F (36.5 C), temperature source Oral, height 5\' 10"  (1.778 m), weight 202 lb 9.6 oz (91.9 kg), SpO2 97 %. Gen:      No acute distress HEENT:  EOMI, sclera anicteric Neck:     No masses; no thyromegaly Lungs:    Clear to auscultation bilaterally; normal respiratory effort CV:         Regular rate and rhythm; no murmurs Abd:      + bowel sounds; soft, non-tender; no palpable masses, no distension Ext:    No edema; adequate peripheral perfusion Skin:      Warm and dry; no rash Neuro: alert and oriented x 3 Psych: normal mood and affect   Data Reviewed: Imaging: Screening CT 09/29/2022-fibrotic interstitial lung disease with groundglass, traction bronchiectasis, subpleural reticular densities.  Alternate pattern.    CT high-resolution 11/18/2022-mild to moderate fibrotic interstitial lung disease without clear gradient.  Indeterminate for UIP I have reviewed the images personally.  PFTs: 01/07/2023 FVC 3.09 [25%], FEV1 2.36 [80%], F/F76, TLC 7.05 [69%], Moderate obstruction, bronchodilator response, mild restriction  Labs: CTD serologies 11/10/2022-ANA 1: 80, cytoplasmic  Assessment:  Assessment for interstitial lung disease His CT scan shows fibrotic lung disease which has been progressive since at least 2018.  Pattern is nonspecific alternate with what looks like upper lobe predominance. He does not have any significant exposures.  He does  have arthritis but no other symptoms of connective tissue disease.  CTD serologies show nonspecific elevation in ANA  He is not interested in aggressive workup such as bronchoscopy or lung biopsy.  Will discuss case and review scans at multidisciplinary conference and make further recommendations regarding workup and  treatment  Plan/Recommendations: Reviewed case at multidisciplinary conference  Marshell Garfinkel MD [facility] 01/08/2023, 10:17 AM  CC: Albina Billet, MD

## 2023-01-08 NOTE — Patient Instructions (Signed)
I will review your case with our conference to get opinions of others I will have you come back in 2 months for review and plan for next steps

## 2023-03-12 ENCOUNTER — Ambulatory Visit: Payer: Medicare HMO | Admitting: Pulmonary Disease

## 2023-03-16 ENCOUNTER — Encounter: Payer: Self-pay | Admitting: Pulmonary Disease

## 2023-03-16 ENCOUNTER — Ambulatory Visit: Payer: Medicare HMO | Admitting: Pulmonary Disease

## 2023-03-16 VITALS — BP 132/80 | HR 70 | Temp 97.7°F | Ht 67.0 in | Wt 199.2 lb

## 2023-03-16 DIAGNOSIS — J849 Interstitial pulmonary disease, unspecified: Secondary | ICD-10-CM

## 2023-03-16 NOTE — Patient Instructions (Signed)
I am glad you are stable with your breathing After our discussion today we will continue to just monitor your lung condition without any treatment for now Will get PFTs in 6 months and return to clinic after PFTs

## 2023-03-16 NOTE — Progress Notes (Signed)
Ralph Melendez    [id number]    [birth year]  Primary Care Physician:Tate, Jillene Bucks, MD  Referring Physician: Jaclyn Shaggy, MD [address]   [place],  [place] [address]  Chief complaint: Follow-up for interstitial lung disease  HPI: 77 y.o. who  has a past medical history of Carotid artery disease ([facility]), GERD (gastroesophageal reflux disease), HTN (hypertension), Pancreatitis, Pancreatitis, Ruptured disk (2011), Stomach ulcer, and TIA (transient ischemic attack).   Referred for evaluation of abnormal screening CT chest He feels well with no issues.  Denies any dyspnea, cough, sputum production, congestion History notable for OSA.  He stopped using CPAP about 15 years ago.  Pets: No pets Occupation: Child psychotherapist, Therapist, sports Exposures: No mold, hot tub, Jacuzzi.  No feather pillows or comforters ILD questionnaire 11/10/2022-negative for any exposures. Smoking history: 55-pack-year smoker.  Quit in 2015 Travel history: No significant travel history Relevant family history: No family history of lung disease  Interim history: States that breathing is stable with no change  Outpatient Encounter Medications as of 03/16/2023  Medication Sig   amLODipine (NORVASC) 5 MG tablet Take 5 mg by mouth daily.   clopidogrel (PLAVIX) 75 MG tablet Take 75 mg by mouth daily.   HYDROcodone-acetaminophen (NORCO/VICODIN) 5-325 MG tablet Take 1 tablet by mouth every 6 (six) hours as needed for moderate pain.   ursodiol (ACTIGALL) 250 MG tablet Take 250 mg by mouth 2 (two) times daily.   No facility-administered encounter medications on file as of 03/16/2023.    Physical Exam: Blood pressure 132/80, pulse 70, temperature 97.7 F (36.5 C), temperature source Oral, height 5\' 7"  (1.702 m), weight 199 lb 3.2 oz (90.4 kg), SpO2 95 %. Gen:      No acute distress HEENT:  EOMI, sclera anicteric Neck:     No masses; no thyromegaly Lungs:    Clear to auscultation bilaterally;  normal respiratory effort CV:         Regular rate and rhythm; no murmurs Abd:      + bowel sounds; soft, non-tender; no palpable masses, no distension Ext:    No edema; adequate peripheral perfusion Skin:      Warm and dry; no rash Neuro: alert and oriented x 3 Psych: normal mood and affect   Data Reviewed: Imaging: Screening CT 09/29/2022-fibrotic interstitial lung disease with groundglass, traction bronchiectasis, subpleural reticular densities.  Alternate pattern.    CT high-resolution 11/18/2022-mild to moderate fibrotic interstitial lung disease without clear gradient.  Indeterminate for UIP I have reviewed the images personally.  PFTs: 01/07/2023 FVC 3.09 [25%], FEV1 2.36 [80%], F/F76, TLC 7.05 [69%], Moderate obstruction, bronchodilator response, mild restriction  Labs: CTD serologies 11/10/2022-ANA 1: 80, cytoplasmic  Assessment:  Assessment for interstitial lung disease His CT scan shows fibrotic lung disease which has been progressive since at least 2018.  Pattern is nonspecific alternate with what looks like upper lobe predominance. He does not have any significant exposures.  He does have arthritis but no other symptoms of connective tissue disease.  CTD serologies show nonspecific elevation in ANA  Discussed case at multidisciplinary conference in May 2024.  Recommend admission to consider biopsy if patient is willing He is not interested in aggressive workup such as bronchoscopy or lung biopsy.    Plan/Recommendations: Continue monitoring for now Check PFTs in 6 months  Chilton Greathouse MD [facility] 03/16/2023, 10:13 AM  CC: Jaclyn Shaggy, MD

## 2023-05-24 ENCOUNTER — Ambulatory Visit: Payer: Medicare HMO

## 2023-06-28 ENCOUNTER — Telehealth: Payer: Self-pay | Admitting: Pulmonary Disease

## 2023-06-28 NOTE — Telephone Encounter (Signed)
Called patient to set up his 6 mon PFT/rov with Dr Isaiah Serge. He stated that he was doing fine and was under the impression that nothing more could be done for him. He said he didn't want to "go through all that" and declined to schedule appointments. Nothing further needed.

## 2023-07-12 ENCOUNTER — Telehealth: Payer: Self-pay | Admitting: Pulmonary Disease

## 2023-07-12 DIAGNOSIS — J849 Interstitial pulmonary disease, unspecified: Secondary | ICD-10-CM

## 2023-07-12 NOTE — Telephone Encounter (Signed)
Pt is requesting for PFT to be done in [facility]

## 2023-07-13 NOTE — Telephone Encounter (Signed)
Called and discussed with patient.  He wants to cancel the PFTs and imaging.  He feels that he would not want any treatment at present and will give [place] a call for a follow-up appointment if he has worsening breathing or if he changes his mind.  Nothing further needed

## 2023-07-15 NOTE — Telephone Encounter (Signed)
Noted in triage. 

## 2023-07-15 NOTE — Addendum Note (Signed)
Addended by: Lajoyce Lauber A on: 07/15/2023 03:16 PM   Modules accepted: Orders

## 2023-11-18 DIAGNOSIS — Z0000 Encounter for general adult medical examination without abnormal findings: Secondary | ICD-10-CM | POA: Diagnosis not present

## 2023-11-18 DIAGNOSIS — E785 Hyperlipidemia, unspecified: Secondary | ICD-10-CM | POA: Diagnosis not present

## 2023-11-18 DIAGNOSIS — N400 Enlarged prostate without lower urinary tract symptoms: Secondary | ICD-10-CM | POA: Diagnosis not present

## 2023-11-18 DIAGNOSIS — I10 Essential (primary) hypertension: Secondary | ICD-10-CM | POA: Diagnosis not present

## 2023-11-18 LAB — HM AWV

## 2024-02-08 ENCOUNTER — Ambulatory Visit: Attending: Pulmonary Disease

## 2024-02-08 DIAGNOSIS — R062 Wheezing: Secondary | ICD-10-CM | POA: Diagnosis not present

## 2024-02-08 DIAGNOSIS — J449 Chronic obstructive pulmonary disease, unspecified: Secondary | ICD-10-CM | POA: Insufficient documentation

## 2024-02-08 DIAGNOSIS — Z87891 Personal history of nicotine dependence: Secondary | ICD-10-CM | POA: Diagnosis not present

## 2024-02-08 DIAGNOSIS — R942 Abnormal results of pulmonary function studies: Secondary | ICD-10-CM | POA: Diagnosis not present

## 2024-02-08 DIAGNOSIS — J849 Interstitial pulmonary disease, unspecified: Secondary | ICD-10-CM | POA: Insufficient documentation

## 2024-02-08 DIAGNOSIS — R0609 Other forms of dyspnea: Secondary | ICD-10-CM | POA: Diagnosis not present

## 2024-02-08 LAB — PULMONARY FUNCTION TEST ARMC ONLY
DL/VA % pred: 95 %
DL/VA: 3.71 ml/min/mmHg/L
DLCO unc % pred: 65 %
DLCO unc: 16.04 ml/min/mmHg
FEF 25-75 Post: 1.63 L/s
FEF 25-75 Pre: 1.23 L/s
FEF2575-%Change-Post: 32 %
FEF2575-%Pred-Post: 80 %
FEF2575-%Pred-Pre: 60 %
FEV1-%Change-Post: 27 %
FEV1-%Pred-Post: 71 %
FEV1-%Pred-Pre: 56 %
FEV1-Post: 2.07 L
FEV1-Pre: 1.63 L
FEV1FVC-%Change-Post: 28 %
FEV1FVC-%Pred-Pre: 80 %
FEV6-%Change-Post: 0 %
FEV6-%Pred-Post: 73 %
FEV6-%Pred-Pre: 73 %
FEV6-Post: 2.79 L
FEV6-Pre: 2.79 L
FEV6FVC-%Change-Post: 1 %
FEV6FVC-%Pred-Post: 106 %
FEV6FVC-%Pred-Pre: 105 %
FVC-%Change-Post: -1 %
FVC-%Pred-Post: 68 %
FVC-%Pred-Pre: 69 %
FVC-Post: 2.79 L
FVC-Pre: 2.83 L
Post FEV1/FVC ratio: 74 %
Post FEV6/FVC ratio: 100 %
Pre FEV1/FVC ratio: 58 %
Pre FEV6/FVC Ratio: 98 %
RV % pred: 80 %
RV: 2.13 L
TLC % pred: 69 %
TLC: 4.91 L

## 2024-02-08 MED ORDER — ALBUTEROL SULFATE (2.5 MG/3ML) 0.083% IN NEBU
2.5000 mg | INHALATION_SOLUTION | Freq: Once | RESPIRATORY_TRACT | Status: AC
  Administered 2024-02-08: 2.5 mg via RESPIRATORY_TRACT
  Filled 2024-02-08: qty 3

## 2024-02-09 ENCOUNTER — Ambulatory Visit: Admitting: Pulmonary Disease

## 2024-02-09 ENCOUNTER — Encounter: Payer: Self-pay | Admitting: Pulmonary Disease

## 2024-02-09 VITALS — BP 136/76 | HR 68 | Temp 98.2°F | Ht 67.0 in | Wt 199.8 lb

## 2024-02-09 DIAGNOSIS — Z87891 Personal history of nicotine dependence: Secondary | ICD-10-CM

## 2024-02-09 DIAGNOSIS — J449 Chronic obstructive pulmonary disease, unspecified: Secondary | ICD-10-CM | POA: Diagnosis not present

## 2024-02-09 DIAGNOSIS — J849 Interstitial pulmonary disease, unspecified: Secondary | ICD-10-CM | POA: Diagnosis not present

## 2024-02-09 LAB — COMPREHENSIVE METABOLIC PANEL WITH GFR
ALT: 14 U/L (ref 0–53)
AST: 17 U/L (ref 0–37)
Albumin: 4.3 g/dL (ref 3.5–5.2)
Alkaline Phosphatase: 59 U/L (ref 39–117)
BUN: 22 mg/dL (ref 6–23)
CO2: 28 meq/L (ref 19–32)
Calcium: 9.9 mg/dL (ref 8.4–10.5)
Chloride: 102 meq/L (ref 96–112)
Creatinine, Ser: 0.95 mg/dL (ref 0.40–1.50)
GFR: 76.27 mL/min (ref >60.00)
Glucose, Bld: 124 mg/dL — ABNORMAL HIGH (ref 70–99)
Potassium: 4.1 meq/L (ref 3.5–5.1)
Sodium: 136 meq/L (ref 135–145)
Total Bilirubin: 0.9 mg/dL (ref 0.2–1.2)
Total Protein: 7.5 g/dL (ref 6.0–8.3)

## 2024-02-09 LAB — CBC WITH DIFFERENTIAL/PLATELET
Basophils Absolute: 0 10*3/uL (ref 0.0–0.1)
Basophils Relative: 0.6 % (ref 0.0–3.0)
Eosinophils Absolute: 0.1 10*3/uL (ref 0.0–0.7)
Eosinophils Relative: 1.8 % (ref 0.0–5.0)
HCT: 44.7 % (ref 39.0–52.0)
Hemoglobin: 14.7 g/dL (ref 13.0–17.0)
Lymphocytes Relative: 34.1 % (ref 12.0–46.0)
Lymphs Abs: 2.3 10*3/uL (ref 0.7–4.0)
MCHC: 33 g/dL (ref 30.0–36.0)
MCV: 93.5 fl (ref 78.0–100.0)
Monocytes Absolute: 0.6 10*3/uL (ref 0.1–1.0)
Monocytes Relative: 8.3 % (ref 3.0–12.0)
Neutro Abs: 3.8 10*3/uL (ref 1.4–7.7)
Neutrophils Relative %: 55.2 % (ref 43.0–77.0)
Platelets: 225 10*3/uL (ref 150.0–400.0)
RBC: 4.79 Mil/uL (ref 4.22–5.81)
RDW: 13.5 % (ref 11.5–15.5)
WBC: 6.8 10*3/uL (ref 4.0–10.5)

## 2024-02-09 MED ORDER — BREZTRI AEROSPHERE 160-9-4.8 MCG/ACT IN AERO: 2.0000 | INHALATION_SPRAY | Freq: Two times a day (BID) | RESPIRATORY_TRACT | 3 refills | Status: DC

## 2024-02-09 NOTE — Patient Instructions (Signed)
 VISIT SUMMARY:  You are a 78 year old male with COPD and interstitial lung disease who came in today due to worsening shortness of breath and cough. Your symptoms have been getting worse, especially when walking, and you have a persistent cough that is more pronounced at night.  YOUR PLAN:  -CHRONIC OBSTRUCTIVE PULMONARY DISEASE (COPD): COPD is a chronic lung disease often caused by smoking, leading to breathing difficulties. We will start you on Breztri  inhaler twice daily as maintenance therapy to help manage your symptoms. You will also receive Breztri  samples to start immediately. Baseline labs will be done to check for any abnormalities before starting the treatment.  -INTERSTITIAL LUNG DISEASE: Interstitial lung disease involves scarring of the lung tissue, which can cause breathing problems. We need to reassess your condition with a high-resolution CT scan. Based on the results, we will decide if a lung biopsy or further treatment is necessary. Please follow up in 1-2 months to review the CT results and determine the next steps.  INSTRUCTIONS:  Please follow up in 1-2 months to review the results of your high-resolution CT scan and determine further management for your interstitial lung disease.

## 2024-02-09 NOTE — Progress Notes (Signed)
 Ralph Melendez    [id number]    Jan 07, 1946  Primary Care Physician:Tate, Ahmed Hough, MD  Referring Physician: Westley Hammers, MD [address]   [place],  [place] [address]  Chief complaint: Follow-up for interstitial lung disease  HPI: 78 y.o. who  has a past medical history of Carotid artery disease (HCC), GERD (gastroesophageal reflux disease), HTN (hypertension), Pancreatitis, Pancreatitis, Ruptured disk (2011), Stomach ulcer, and TIA (transient ischemic attack).   Referred for evaluation of abnormal screening CT chest He feels well with no issues.  Denies any dyspnea, cough, sputum production, congestion History notable for OSA.  He stopped using CPAP about 15 years ago.  Pets: No pets Occupation: Child psychotherapist, Therapist, sports Exposures: No mold, hot tub, Jacuzzi.  No feather pillows or comforters ILD questionnaire 11/10/2022-negative for any exposures. Smoking history: 55-pack-year smoker.  Quit in 2015 Travel history: No significant travel history Relevant family history: No family history of lung disease  Interim history:  Discussed the use of AI scribe software for clinical note transcription with the patient, who gave verbal consent to proceed.  History of Present Illness He is a 78 year old male with COPD and interstitial lung disease who presents with worsening shortness of breath and cough.  He experiences significant shortness of breath, particularly when walking, which has progressively worsened. He becomes short of breath upon entering the clinic. He also has a persistent cough that is more pronounced at night, often waking him up and requiring him to sit up to catch his breath. He coughs during the day as well.  He has a history of COPD, described as moderate to severe, likely related to his past smoking history. He has not been on any medication for COPD until now. A recent lung function test showed improvement after using an inhaler,  indicating a positive response to bronchodilators.  He also has interstitial lung disease, identified over a year ago. A previous CT scan showed indeterminate patterns, and a lung biopsy was considered but not performed. He has not had a repeat CT scan since February 2024.  He quit smoking ten years ago, which is significant given his respiratory conditions. He has been very active in the past but now finds it difficult to maintain his previous level of activity due to his respiratory symptoms.  He is not currently on any maintenance inhalers for his COPD, but he mentions using Breo or Breztri  in the past.    Outpatient Encounter Medications as of 02/09/2024  Medication Sig   amLODipine  (NORVASC ) 5 MG tablet Take 5 mg by mouth daily.   clopidogrel  (PLAVIX ) 75 MG tablet Take 75 mg by mouth daily.   HYDROcodone-acetaminophen  (NORCO/VICODIN) 5-325 MG tablet Take 1 tablet by mouth every 6 (six) hours as needed for moderate pain.   ursodiol  (ACTIGALL ) 250 MG tablet Take 250 mg by mouth 2 (two) times daily.   No facility-administered encounter medications on file as of 02/09/2024.    Physical Exam: Blood pressure 136/76, pulse 68, temperature 98.2 F (36.8 C), temperature source Oral, height 5\' 7"  (1.702 m), weight 199 lb 12.8 oz (90.6 kg), SpO2 98%. Gen:      No acute distress HEENT:  EOMI, sclera anicteric Neck:     No masses; no thyromegaly Lungs:    Clear to auscultation bilaterally; normal respiratory effort CV:         Regular rate and rhythm; no murmurs Abd:      +  bowel sounds; soft, non-tender; no palpable masses, no distension Ext:    No edema; adequate peripheral perfusion Skin:      Warm and dry; no rash Neuro: alert and oriented x 3 Psych: normal mood and affect   Data Reviewed: Imaging: Screening CT 09/29/2022-fibrotic interstitial lung disease with groundglass, traction bronchiectasis, subpleural reticular densities.  Alternate pattern.    CT high-resolution 11/18/2022-mild  to moderate fibrotic interstitial lung disease without clear gradient.  Indeterminate for UIP I have reviewed the images personally.  PFTs: 01/07/2023 FVC 3.09 [25%], FEV1 2.36 [80%], F/F76, TLC 7.05 [69%], Moderate obstruction, bronchodilator response, mild restriction  02/08/2024 FVC 2.79 [68%], FEV1 2.07 [71%], F/F74, TLC 7.05 [69%], DLCO 16.04 [65%] Moderate obstruction, moderate restriction, mild diffusion defect  Labs: CTD serologies 11/10/2022-ANA 1: 80, cytoplasmic  Assessment & Plan Chronic Obstructive Pulmonary Disease (COPD) Moderate to severe COPD, likely secondary to smoking history, with symptoms of dyspnea and cough. Lung function test shows improvement with inhaler use, indicating potential benefit from inhaler therapy. He has not been on any medication for COPD previously. - Initiate Breztri  inhaler twice daily as maintenance therapy. - Provide Breztri  samples for immediate use. - Order baseline labs to assess for abnormalities before starting treatment.  Interstitial Lung Disease Indeterminate pattern on previous CT scan with symptoms of cough and dyspnea.  Case was previously discussed at multidisciplinary conference in May 2024 and recommendation was to consider biopsy if patient is willing.  At that time patient did not want to proceed but now wants a reassessment as his symptoms are worse  A repeat high-resolution CT scan is needed to reassess the condition and determine if a lung biopsy or treatment is necessary. The decision to proceed with treatment will be based on the CT findings, and the potential need for a lung biopsy will be reassessed.  Recheck labs including CBC, CMP, CTD profile, alpha-1 antitrypsin levels and phenotype  - Order high-resolution CT scan to reassess interstitial lung disease. - Follow up in 1-2 months to review CT results and determine further management.    Plan/Recommendations: Start Breztri  inhaler Check labs High-res CT  Phyllis Breeze MD [facility] 02/09/2024, 1:58 PM  CC: Westley Hammers, MD

## 2024-02-14 LAB — TIQ-NTM

## 2024-02-14 LAB — ANA,IFA RA DIAG PNL W/RFLX TIT/PATN
Anti Nuclear Antibody (ANA): POSITIVE — AB
Cyclic Citrullin Peptide Ab: 17 U
Rheumatoid fact SerPl-aCnc: <10 [IU]/mL (ref <14)

## 2024-02-14 LAB — ANTI-NUCLEAR AB-TITER (ANA TITER): ANA Titer 1: 1:80 {titer} — ABNORMAL HIGH

## 2024-02-14 LAB — IGE: IgE (Immunoglobulin E), Serum: 156 kU/L — ABNORMAL HIGH (ref <=114)

## 2024-02-14 LAB — ALPHA-1 ANTITRYPSIN PHENOTYPE: ALPHA-1-ANTITRYPSIN (AAT) PHENOTYPE: 147 mg/dL (ref 83–199)

## 2024-02-16 ENCOUNTER — Ambulatory Visit

## 2024-02-18 ENCOUNTER — Ambulatory Visit
Admission: RE | Admit: 2024-02-18 | Discharge: 2024-02-18 | Disposition: A | Discharge status: Home / Self Care | Source: Ambulatory Visit | Attending: Pulmonary Disease | Admitting: Pulmonary Disease

## 2024-02-18 DIAGNOSIS — J849 Interstitial pulmonary disease, unspecified: Secondary | ICD-10-CM | POA: Insufficient documentation

## 2024-02-18 DIAGNOSIS — J84112 Idiopathic pulmonary fibrosis: Secondary | ICD-10-CM | POA: Diagnosis not present

## 2024-02-18 DIAGNOSIS — R918 Other nonspecific abnormal finding of lung field: Secondary | ICD-10-CM | POA: Diagnosis not present

## 2024-02-18 DIAGNOSIS — R911 Solitary pulmonary nodule: Secondary | ICD-10-CM | POA: Diagnosis not present

## 2024-03-02 ENCOUNTER — Telehealth: Payer: Self-pay

## 2024-03-02 ENCOUNTER — Ambulatory Visit: Payer: Self-pay | Admitting: Pulmonary Disease

## 2024-03-02 DIAGNOSIS — R911 Solitary pulmonary nodule: Secondary | ICD-10-CM

## 2024-03-02 NOTE — Telephone Encounter (Signed)
 Copied from CRM [id number]. Topic: General - Other >> Mar 01, 2024  2:31 PM Whitney O wrote: Reason for CRM: ms diane from [facility] radiology is calling to see if the report could be seen. And wanted to let provider to know report is in   Results have be reviewed by MD and discussed with pt.  Please refer to CT result note.

## 2024-03-06 ENCOUNTER — Ambulatory Visit: Admitting: Acute Care

## 2024-03-06 ENCOUNTER — Encounter: Payer: Self-pay | Admitting: Acute Care

## 2024-03-06 VITALS — BP 132/70 | HR 67 | Ht 67.0 in | Wt 197.2 lb

## 2024-03-06 DIAGNOSIS — R911 Solitary pulmonary nodule: Secondary | ICD-10-CM

## 2024-03-06 DIAGNOSIS — J849 Interstitial pulmonary disease, unspecified: Secondary | ICD-10-CM

## 2024-03-06 DIAGNOSIS — Z87891 Personal history of nicotine dependence: Secondary | ICD-10-CM

## 2024-03-06 NOTE — Progress Notes (Signed)
 History of Present Illness Ralph Melendez is a 78 y.o. male former smoker ( Quit 2015) with ILD and COPD followed initially through the lung cancer screening program. Now followed by Dr. Waylan Haggard for ILD, and COPD.   03/06/2024 Pt. Presents for nodule referral by Dr. Waylan Haggard of a new 1.5 cm RLL pulmonary nodule, worrisome for adenocarcinoma. He has a PET scan that has been ordered, but not yet done ( 03/14/2024).  Pt. States he has been doing well otherwise. No unexplained weight loss or hemoptysis. We discussed the option of scheduling for bronchoscopy with biopsies to better evaluate this nodule  now, vs waiting for the PET scan to be done and resulted. Both the patient and his wife prefer to wait until after the PET scan has been completed. They live in [place], and patient's wife sees Dr. Viva Grise at the [place] office. I have messaged with Dr. Viva Grise , and she will see the patient June 6 th at 9 am to review his PET scan and determine if Bronchoscopy with biopsies is indicated. If it is necessary, she can manage the procedure for Mr. Easterly. Both patient and his wife are pleased with this plan. They know to call if they need something sooner.     Test Results: HRCT Chest 02/2024 Pulmonary parenchymal pattern of interstitial lung disease, as detailed above, similar to 11/18/2022. Findings may be due to fibrotic nonspecific interstitial pneumonitis or usual interstitial pneumonitis. Findings are indeterminate for UIP per consensus guidelines: Diagnosis of Idiopathic Pulmonary Fibrosis: An Official ATS/ERS/JRS/ALAT Clinical Practice Guideline. Am Annie Barton Crit Care Med Vol 198, Iss 5, [phone], Jun 19 2017. New 1.5 cm right lower lobe nodule, worrisome for adenocarcinoma. These results will be called to the ordering clinician or representative by the Radiologist Assistant, and communication documented in the PACS or [employer]. Aortic atherosclerosis (ICD10-I70.0). Coronary  artery calcification. Enlarged pulmonic trunk, indicative of pulmonary arterial hypertension.  Imaging: Screening CT 09/29/2022-fibrotic interstitial lung disease with groundglass, traction bronchiectasis, subpleural reticular densities.  Alternate pattern.     CT high-resolution 11/18/2022-mild to moderate fibrotic interstitial lung disease without clear gradient.  Indeterminate for UIP    PFTs: 01/07/2023 FVC 3.09 [25%], FEV1 2.36 [80%], F/F76, TLC 7.05 [69%], Moderate obstruction, bronchodilator response, mild restriction   02/08/2024 FVC 2.79 [68%], FEV1 2.07 [71%], F/F74, TLC 7.05 [69%], DLCO 16.04 [65%] Moderate obstruction, moderate restriction, mild diffusion defect       Latest Ref Rng & Units 02/09/2024    2:28 PM 01/01/2020    7:32 PM 12/23/2019    6:06 PM  CBC  WBC 4.0 - 10.5 K/uL 6.8  16.1  13.4   Hemoglobin 13.0 - 17.0 g/dL 14.7  82.9  56.2   Hematocrit 39.0 - 52.0 % 44.7  41.2  42.9   Platelets 150.0 - 400.0 K/uL 225.0  382  206        Latest Ref Rng & Units 02/09/2024    2:28 PM 11/10/2022    1:41 PM 01/01/2020    8:05 PM  BMP  Glucose 70 - 99 mg/dL 130  865  784   BUN 6 - 23 mg/dL 22  18  15    Creatinine 0.40 - 1.50 mg/dL 6.96  2.95  2.84   Sodium 135 - 145 mEq/L 136  136  135   Potassium 3.5 - 5.1 mEq/L 4.1  4.2  4.1   Chloride 96 - 112 mEq/L 102  99  99   CO2 19 - 32 mEq/L  28  28  30    Calcium  8.4 - 10.5 mg/dL 9.9  45.4  8.6     BNP    Component Value Date/Time   BNP 81.0 01/01/2020 1934    ProBNP No results found for: "PROBNP"  PFT    Component Value Date/Time   FEV1PRE 1.63 02/08/2024 1538   FEV1POST 2.07 02/08/2024 1538   FVCPRE 2.83 02/08/2024 1538   FVCPOST 2.79 02/08/2024 1538   TLC 4.91 02/08/2024 1538   DLCOUNC 16.04 02/08/2024 1538   PREFEV1FVCRT 58 02/08/2024 1538   PSTFEV1FVCRT 74 02/08/2024 1538    CT CHEST HIGH RESOLUTION Result Date: 03/01/2024 CLINICAL DATA:  Interstitial lung disease. Chronic shortness of breath. Lung  nodule. EXAM: CT CHEST WITHOUT CONTRAST TECHNIQUE: Multidetector CT imaging of the chest was performed following the standard protocol without intravenous contrast. High resolution imaging of the lungs, as well as inspiratory and expiratory imaging, was performed. RADIATION DOSE REDUCTION: This exam was performed according to the departmental dose-optimization program which includes automated exposure control, adjustment of the mA and/or kV according to patient size and/or use of iterative reconstruction technique. COMPARISON:  11/18/2022 and 10/09/2021. FINDINGS: Cardiovascular: Atherosclerotic calcification of the aorta, aortic valve and coronary arteries. Enlarged pulmonic trunk and heart. No pericardial effusion. Mediastinum/Nodes: No pathologically enlarged mediastinal or axillary lymph nodes. Hilar regions are difficult to definitively evaluate without IV contrast. Esophagus is grossly unremarkable. Lungs/Pleura: Upper lobe interstitial coarsening and ground-glass with peripheral and basilar subpleural reticulation, traction bronchiectasis/bronchiolectasis and coarsened ground-glass. Findings are similar to 11/18/2022. Irregular right lower lobe nodule measures 1.3 x 1.6 cm (4/66), new from 11/18/2022. No pleural fluid. Airway is unremarkable. No air trapping. Upper Abdomen: Chronic pneumobilia. Cholecystectomy. Hiatal hernia repair. Visualized portions of the liver, adrenal glands, kidneys, spleen, pancreas, stomach and bowel are otherwise grossly unremarkable. No upper abdominal adenopathy. Musculoskeletal: Degenerative changes in the spine. IMPRESSION: 1. Pulmonary parenchymal pattern of interstitial lung disease, as detailed above, similar to 11/18/2022. Findings may be due to fibrotic nonspecific interstitial pneumonitis or usual interstitial pneumonitis. Findings are indeterminate for UIP per consensus guidelines: Diagnosis of Idiopathic Pulmonary Fibrosis: An Official ATS/ERS/JRS/ALAT Clinical Practice  Guideline. Am Annie Barton Crit Care Med Vol 198, Iss 5, [phone], Jun 19 2017. 2. New 1.5 cm right lower lobe nodule, worrisome for adenocarcinoma. These results will be called to the ordering clinician or representative by the Radiologist Assistant, and communication documented in the PACS or [employer]. 3. Aortic atherosclerosis (ICD10-I70.0). Coronary artery calcification. 4. Enlarged pulmonic trunk, indicative of pulmonary arterial hypertension. Electronically Signed   By: Shearon Denis M.D.   On: 03/01/2024 14:18     Past medical hx Past Medical History:  Diagnosis Date   Carotid artery disease (HCC)    GERD (gastroesophageal reflux disease)    HTN (hypertension)    Pancreatitis    Pancreatitis    Ruptured disk 2011   Stomach ulcer    TIA (transient ischemic attack)      Social History   Tobacco Use   Smoking status: Former    Current packs/day: 0.00    Average packs/day: 1 pack/day for 56.0 years (56.0 ttl pk-yrs)    Types: Cigarettes    Start date: 10/07/1958    Quit date: 10/07/2014    Years since quitting: 9.4   Smokeless tobacco: Never  Substance Use Topics   Alcohol use: No    Alcohol/week: 0.0 standard drinks of alcohol   Drug use: No    Mr.Renstrom reports that he quit smoking  about 9 years ago. His smoking use included cigarettes. He started smoking about 65 years ago. He has a 56 pack-year smoking history. He has never used smokeless tobacco. He reports that he does not drink alcohol and does not use drugs.  Tobacco Cessation: Counseling given: Not Answered Former smoker , quit 2015 with a 56 pack year smoking history.  Past surgical hx, Family hx, Social hx all reviewed.  Current Outpatient Medications on File Prior to Visit  Medication Sig   amLODipine  (NORVASC ) 5 MG tablet Take 5 mg by mouth daily.   budeson-glycopyrrolate-formoterol (BREZTRI  AEROSPHERE) 160-9-4.8 MCG/ACT AERO inhaler Inhale 2 puffs into the lungs in the morning and at bedtime.    clopidogrel  (PLAVIX ) 75 MG tablet Take 75 mg by mouth daily.   HYDROcodone-acetaminophen  (NORCO/VICODIN) 5-325 MG tablet Take 1 tablet by mouth every 6 (six) hours as needed for moderate pain.   ursodiol  (ACTIGALL ) 250 MG tablet Take 250 mg by mouth 2 (two) times daily.   No current facility-administered medications on file prior to visit.     Allergies  Allergen Reactions   Contrast Media [Iodinated Contrast Media] Anaphylaxis   Shrimp [Shellfish Allergy] Anaphylaxis    Review Of Systems:  Constitutional:   No  weight loss, night sweats,  Fevers, chills, fatigue, or  lassitude.  HEENT:   No headaches,  Difficulty swallowing,  Tooth/dental problems, or  Sore throat,                No sneezing, itching, ear ache, nasal congestion, post nasal drip,   CV:  No chest pain,  Orthopnea, PND, swelling in lower extremities, anasarca, dizziness, palpitations, syncope.   GI  No heartburn, indigestion, abdominal pain, nausea, vomiting, diarrhea, change in bowel habits, loss of appetite, bloody stools.   Resp: No shortness of breath with exertion or at rest.  No excess mucus, no productive cough,  No non-productive cough,  No coughing up of blood.  No change in color of mucus.  No wheezing.  No chest wall deformity  Skin: no rash or lesions.  GU: no dysuria, change in color of urine, no urgency or frequency.  No flank pain, no hematuria   MS:  No joint pain or swelling.  No decreased range of motion.  No back pain.  Psych:  No change in mood or affect. No depression or anxiety.  No memory loss.   Vital Signs BP 132/70 (BP Location: Left Arm, Patient Position: Sitting, Cuff Size: Normal)   Pulse 67   Ht 5\' 7"  (1.702 m)   Wt 197 lb 3.2 oz (89.4 kg)   SpO2 94%   BMI 30.89 kg/m    Physical Exam:  General- No distress,  A&Ox3, pleasant ENT: No sinus tenderness, TM clear, pale nasal mucosa, no oral exudate,no post nasal drip, no LAN Cardiac: S1, S2, regular rate and rhythm, no  murmur Chest: No wheeze/ rales/ dullness; no accessory muscle use, no nasal flaring, no sternal retractions, diminished per bases Abd.: Soft Non-tender, ND, BS +, Body mass index is 30.89 kg/m.  Ext: No clubbing cyanosis, edema Neuro:  normal strength, MAE x 4, A&O x 3, appropriate Skin: No rashes, warm and dry, no obvious lesions  Psych: normal mood and behavior   Assessment/Plan New 1.5 cm pulmonary nodule in former smoker with ILD Plan It is good to see you today. We will have you follow up with Dr. Viva Grise at the [facility] office to review your PET scan within a week of PET being  completed. Dr. Viva Grise can determine if you will need a bronchoscopy with biopsies once she reviews the scan.  Please call if you need us  for anything before you see her.  Please contact office for sooner follow up if symptoms do not improve or worsen or seek emergency care    I spent 25 minutes dedicated to the care of this patient on the date of this encounter to include pre-visit review of records, face-to-face time with the patient discussing conditions above, post visit ordering of testing, clinical documentation with the electronic health record, making appropriate referrals as documented, and communicating necessary information to the patient's healthcare team.     Raejean Bullock, NP 03/06/2024  11:07 AM

## 2024-03-06 NOTE — Patient Instructions (Addendum)
 It is good to see you today. We will have you follow up with Dr. Viva Grise at the [facility] office to review your PET scan within a week of PET being completed. Dr. Viva Grise can determine if you will need a bronchoscopy with biopsies once she reviews the scan.  Please call if you need us  for anything before you see her.  Please contact office for sooner follow up if symptoms do not improve or worsen or seek emergency care

## 2024-03-07 DIAGNOSIS — H40013 Open angle with borderline findings, low risk, bilateral: Secondary | ICD-10-CM | POA: Diagnosis not present

## 2024-03-07 DIAGNOSIS — H43812 Vitreous degeneration, left eye: Secondary | ICD-10-CM | POA: Diagnosis not present

## 2024-03-07 DIAGNOSIS — H2513 Age-related nuclear cataract, bilateral: Secondary | ICD-10-CM | POA: Diagnosis not present

## 2024-03-07 DIAGNOSIS — H33302 Unspecified retinal break, left eye: Secondary | ICD-10-CM | POA: Diagnosis not present

## 2024-03-14 ENCOUNTER — Ambulatory Visit
Admission: RE | Admit: 2024-03-14 | Discharge: 2024-03-14 | Disposition: A | Discharge status: Home / Self Care | Source: Ambulatory Visit | Attending: Pulmonary Disease | Admitting: Pulmonary Disease

## 2024-03-14 DIAGNOSIS — R911 Solitary pulmonary nodule: Secondary | ICD-10-CM | POA: Diagnosis not present

## 2024-03-14 DIAGNOSIS — Q5522 Retractile testis: Secondary | ICD-10-CM | POA: Diagnosis not present

## 2024-03-14 DIAGNOSIS — I517 Cardiomegaly: Secondary | ICD-10-CM | POA: Diagnosis not present

## 2024-03-14 DIAGNOSIS — I2510 Atherosclerotic heart disease of native coronary artery without angina pectoris: Secondary | ICD-10-CM | POA: Insufficient documentation

## 2024-03-14 DIAGNOSIS — K5730 Diverticulosis of large intestine without perforation or abscess without bleeding: Secondary | ICD-10-CM | POA: Insufficient documentation

## 2024-03-14 DIAGNOSIS — I700 Atherosclerosis of aorta: Secondary | ICD-10-CM | POA: Insufficient documentation

## 2024-03-14 DIAGNOSIS — N281 Cyst of kidney, acquired: Secondary | ICD-10-CM | POA: Insufficient documentation

## 2024-03-14 DIAGNOSIS — I672 Cerebral atherosclerosis: Secondary | ICD-10-CM | POA: Diagnosis not present

## 2024-03-14 DIAGNOSIS — J189 Pneumonia, unspecified organism: Secondary | ICD-10-CM | POA: Diagnosis not present

## 2024-03-14 DIAGNOSIS — I6523 Occlusion and stenosis of bilateral carotid arteries: Secondary | ICD-10-CM | POA: Diagnosis not present

## 2024-03-14 LAB — GLUCOSE, CAPILLARY: Glucose-Capillary: 118 mg/dL — ABNORMAL HIGH (ref 70–99)

## 2024-03-14 MED ORDER — FLUDEOXYGLUCOSE F - 18 (FDG) INJECTION
10.4500 | Freq: Once | INTRAVENOUS | Status: AC | PRN
  Administered 2024-03-14: 10.45 via INTRAVENOUS

## 2024-03-21 ENCOUNTER — Ambulatory Visit: Admitting: Pulmonary Disease

## 2024-03-21 ENCOUNTER — Encounter: Payer: Self-pay | Admitting: Pulmonary Disease

## 2024-03-21 VITALS — BP 120/82 | HR 61 | Temp 97.1°F | Ht 67.0 in | Wt 198.2 lb

## 2024-03-21 DIAGNOSIS — J849 Interstitial pulmonary disease, unspecified: Secondary | ICD-10-CM

## 2024-03-21 DIAGNOSIS — Z87891 Personal history of nicotine dependence: Secondary | ICD-10-CM

## 2024-03-21 DIAGNOSIS — R911 Solitary pulmonary nodule: Secondary | ICD-10-CM

## 2024-03-21 DIAGNOSIS — R0609 Other forms of dyspnea: Secondary | ICD-10-CM

## 2024-03-21 DIAGNOSIS — R053 Chronic cough: Secondary | ICD-10-CM | POA: Diagnosis not present

## 2024-03-21 DIAGNOSIS — J189 Pneumonia, unspecified organism: Secondary | ICD-10-CM | POA: Diagnosis not present

## 2024-03-21 NOTE — Patient Instructions (Signed)
 VISIT SUMMARY:  Today, you were seen for a follow-up regarding a new lung nodule that was previously identified. The nodule has resolved on subsequent imaging. You also reported a persistent cough and shortness of breath, particularly during physical activities. We discussed your history of pulmonary fibrosis and other health concerns.  YOUR PLAN:  - INTERSTITIAL LUNG DISEASE:  is a condition where lung tissue becomes scarred and stiff, making it difficult to breathe. Your recent imaging showed no acute changes, and you will continue to follow up with your pulmonary specialist, Dr. Waylan Haggard. No further procedures are needed at this time as the nodule has resolved.  -RESOLVED PNEUMONIA: The previously identified lung nodule was likely a small area of pneumonia, which has now resolved. There is no sign of cancer. However, you still have a residual cough, likely due to the prior pneumonia.  Use Mucinex DM to help airway clearance.  Please notify us  if you develop any fevers or chills or if your cough becomes productive of discolored sputum.  -COUGH: Your persistent cough is likely due to the resolved pneumonia. You do not have any wheezing, productive cough, fever, or chills.  You indicate that it is getting better.  -ENLARGED HEART: An enlarged heart was noted on your imaging, which could be contributing to your shortness of breath during physical activities. The last echocardiogram you had was in 2015. We will order a new echocardiogram to assess your heart function and see if it is contributing to your symptoms.  -ATHEROSCLEROSIS: Atherosclerosis is the buildup of fats, cholesterol, and other substances in and on your artery walls, which can restrict blood flow. This is consistent with age-related changes.  INSTRUCTIONS:  Please follow up with your pulmonary specialist, Dr. Waylan Haggard, as planned. An echocardiogram has been ordered to assess your heart function. No further procedures are needed for the  resolved lung nodule.

## 2024-03-21 NOTE — Progress Notes (Signed)
 Subjective:    Patient ID: Ralph Melendez, male    DOB: 08-Aug-1946, 78 y.o.   MRN: [id number]  Patient Care Team: Westley Hammers, MD as PCP - General (Internal Medicine) Marquita Situ Magali Schmitz, MD (General Surgery) Westley Hammers, MD (Internal Medicine) Mannam, Praveen, MD as Consulting Physician (Pulmonary Disease)  Chief Complaint  Patient presents with   Consult    Nodule. Dry cough. DOE. No wheezing.     BACKGROUND:78 y.o. who  has a past medical history of Carotid artery disease (HCC), GERD (gastroesophageal reflux disease), HTN (hypertension), Pancreatitis, Pancreatitis, Ruptured disk (2011), Stomach ulcer, and TIA (transient ischemic attack).  Presents for evaluation of a recently detected lung nodule and consideration for robotic assisted bronchoscopy.  His primary pulmonologist is Dr. Phyllis Breeze.  HPI Discussed the use of AI scribe software for clinical note transcription with the patient, who gave verbal consent to proceed.  History of Present Illness   Ralph Melendez is a 78 year old male with pulmonary fibrosis who presents for evaluation of a new lung nodule.  He presents with his wife.  A CT chest and PET CT initially identified the lung nodule, but subsequent imaging showed resolution.  This studies however were consistent with likely inflammatory/infectious etiology.  All of this appears to be resolving.  He experiences a persistent cough, described as 'coughing pretty bad,' without sputum production. No wheezing, fevers, or chills are present.  No hemoptysis.  The cough however has been present rough longstanding and has not changed in character.  No gastroesophageal reflux symptoms.  No recent weight loss or anorexia.  He has a history of shortness of breath, particularly during physical activities, which has been attributed to his interstitial lung disease.  Review of his imaging shows that there is cardiomegaly on CT.  Last echocardiogram was in 2015.  He worked in the  [employer], specifically in [employer], and has no history of Financial planner.   He has a prior history of pancreatitis which was quite severe.  I reviewed the imaging available independently and with the patient.  I have reviewed Dr. Isabel Many notes.    DATA PFTs: 01/07/2023 FVC 3.09 [25%], FEV1 2.36 [80%], F/F76, TLC 7.05 [69%], Moderate obstruction, bronchodilator response, mild restriction   02/08/2024 FVC 2.79 [68%], FEV1 2.07 [71%], F/F74, TLC 7.05 [69%], DLCO 16.04 [65%] Moderate obstruction, moderate restriction, mild diffusion defect   Labs: CTD serologies 11/10/2022-ANA 1: 80, cytoplasmic  Review of Systems A 10 point review of systems was performed and it is as noted above otherwise negative.   Past Medical History:  Diagnosis Date   Carotid artery disease (HCC)    GERD (gastroesophageal reflux disease)    HTN (hypertension)    Pancreatitis    Pancreatitis    Ruptured disk 2011   Stomach ulcer    TIA (transient ischemic attack)     Past Surgical History:  Procedure Laterality Date   BACK SURGERY  2011   carotid  2008   cleaning   COLONOSCOPY  2005   NECK SURGERY     STOMACH SURGERY  [phone]   UPPER GI ENDOSCOPY     x 3    Patient Active Problem List   Diagnosis Date Noted   ILD (interstitial lung disease) (HCC) 02/08/2024   Personal history of tobacco use, presenting hazards to health 11/25/2016   Pancreatitis, acute 09/16/2015   HTN (hypertension) 09/16/2015   Carotid artery disease (HCC) 09/16/2015   GERD (gastroesophageal reflux  disease) 09/16/2015   Encounter for screening colonoscopy 07/25/2014    Family History  Problem Relation Age of Onset   Breast cancer Mother     Social History   Tobacco Use   Smoking status: Former    Current packs/day: 0.00    Average packs/day: 1 pack/day for 56.0 years (56.0 ttl pk-yrs)    Types: Cigarettes    Start date: 10/07/1958    Quit date: 10/07/2014    Years since quitting: 9.4    Smokeless tobacco: Never  Substance Use Topics   Alcohol use: No    Alcohol/week: 0.0 standard drinks of alcohol    Allergies  Allergen Reactions   Contrast Media [Iodinated Contrast Media] Anaphylaxis   Shrimp [Shellfish Allergy] Anaphylaxis    Current Meds  Medication Sig   amLODipine  (NORVASC ) 5 MG tablet Take 5 mg by mouth daily.   budeson-glycopyrrolate-formoterol (BREZTRI  AEROSPHERE) 160-9-4.8 MCG/ACT AERO inhaler Inhale 2 puffs into the lungs in the morning and at bedtime.   clopidogrel  (PLAVIX ) 75 MG tablet Take 75 mg by mouth daily.   HYDROcodone-acetaminophen  (NORCO/VICODIN) 5-325 MG tablet Take 1 tablet by mouth every 6 (six) hours as needed for moderate pain.   ursodiol  (ACTIGALL ) 250 MG tablet Take 250 mg by mouth 2 (two) times daily.    Immunization History  Administered Date(s) Administered   Fluad Quad(high Dose 65+) 08/20/2023   Influenza Split 07/20/2019   PFIZER(Purple Top)SARS-COV-2 Vaccination 03/05/2020, 03/26/2020        Objective:     BP 120/82 (BP Location: Right Arm, Cuff Size: Normal)   Pulse 61   Temp (!) 97.1 F (36.2 C)   Ht 5\' 7"  (1.702 m)   Wt 198 lb 3.2 oz (89.9 kg)   SpO2 96%   BMI 31.04 kg/m   SpO2: 96 % O2 Device: None (Room air)  GENERAL: Well-developed, mildly overweight gentleman in no acute distress.  Fully ambulatory, no conversational dyspnea. HEAD: Normocephalic, atraumatic.  EYES: Pupils equal, round, reactive to light.  No scleral icterus.  MOUTH: Nose/mouth/throat not examined due to patient wearing a mask. NECK: Supple. No thyromegaly. Trachea midline. No JVD.  No adenopathy. PULMONARY: Good air entry bilaterally.  No adventitious sounds. CARDIOVASCULAR: S1 and S2. Regular rate and rhythm.  Coarse breath sounds with no wheezes or rhonchi noted. ABDOMEN: Benign. MUSCULOSKELETAL: No joint deformity, no clubbing, no edema.  NEUROLOGIC: No overt focal deficit, no gait disturbance, speech is fluent. SKIN:  Intact,warm,dry. PSYCH: Mood and behavior normal.   Representative image from high-resolution chest CT performed 18 Feb 2024 showing a 1.5 cm nodule on the right lower lobe, nodule had air bronchograms through it (arrow):     Representative image from follow-up PET/CT showing resolution of the right lower lobe finding and left lower lobe pneumonia which will need to be radiographically follow-up to resolution:      Assessment & Plan:     ICD-10-CM   1. Lung nodule - RESOLVED  R91.1     2. ILD (interstitial lung disease) (HCC)  J84.9     3. Dyspnea on exertion  R06.09 ECHOCARDIOGRAM COMPLETE    4. Former smoker  Z87.891       Orders Placed This Encounter  Procedures   ECHOCARDIOGRAM COMPLETE    Standing Status:   Future    Expected Date:   04/03/2024    Expiration Date:   03/21/2025    Where should this test be performed:   [facility]    Please indicate who you  request to read the nuc med / echo results.:   [facility] Readers    Perflutren DEFINITY (image enhancing agent) should be administered unless hypersensitivity or allergy exist:   Administer Perflutren    Reason for exam-Echo:   Dyspnea  R06.00   Discussion:    Pulmonary fibrosis Chronic pulmonary fibrosis with lung scarring. No acute changes on recent imaging. Follow-up with pulmonary specialist Dr. Alecia Huntsman is ongoing. No bronchoscopy needed as the nodule has resolved.  Resolved pneumonia Previously identified nodule on CT was likely a small area of pneumonia, now resolved. No malignancy detected on PET CT. Residual cough persists, likely due to prior pneumonia. - Patient currently asymptomatic. - Withholding antibiotics for now unless develops symptoms given prior history of pancreatitis.  Cough Persistent cough likely secondary to resolved pneumonia. No wheezing, productive cough, fever, or chills reported. - Prescribe 3-day course of antibiotics for residual cough.  Enlarged heart Enlarged heart noted  on imaging. Last echocardiogram was in 2015. Potential contribution to exertional dyspnea. No current cardiology follow-up. Echocardiogram is noninvasive and will assess cardiac function and potential contribution to dyspnea. - Order echocardiogram to assess cardiac function and potential contribution to dyspnea.  Atherosclerosis Atherosclerosis noted, consistent with age-related changes.      Patient has a follow-up visit with Dr. Phyllis Breeze on 13 April 2024 at 11 AM.  I discussed the above with Dr. Waylan Haggard via secure chat.  Advised if symptoms do not improve or worsen, to please contact office for sooner follow up or seek emergency care.    I spent 35 minutes of dedicated to the care of this patient on the date of this encounter to include pre-visit review of records, face-to-face time with the patient discussing conditions above, post visit ordering of testing, clinical documentation with the electronic health record, making appropriate referrals as documented, and communicating necessary findings to members of the patients care team.   C. Chloe Counter, MD Advanced Bronchoscopy PCCM [facility]    *This note was dictated using voice recognition software/Dragon.  Despite best efforts to proofread, errors can occur which can change the meaning. Any transcriptional errors that result from this process are unintentional and may not be fully corrected at the time of dictation.

## 2024-03-28 ENCOUNTER — Ambulatory Visit: Payer: Self-pay | Admitting: Pulmonary Disease

## 2024-03-31 ENCOUNTER — Ambulatory Visit: Admission: RE | Admit: 2024-03-31 | Source: Ambulatory Visit

## 2024-04-13 ENCOUNTER — Encounter: Payer: Self-pay | Admitting: Pulmonary Disease

## 2024-04-13 ENCOUNTER — Ambulatory Visit: Admitting: Pulmonary Disease

## 2024-04-13 VITALS — BP 120/80 | HR 64 | Ht 67.0 in | Wt 194.2 lb

## 2024-04-13 DIAGNOSIS — R911 Solitary pulmonary nodule: Secondary | ICD-10-CM | POA: Diagnosis not present

## 2024-04-13 DIAGNOSIS — I517 Cardiomegaly: Secondary | ICD-10-CM

## 2024-04-13 DIAGNOSIS — J209 Acute bronchitis, unspecified: Secondary | ICD-10-CM

## 2024-04-13 DIAGNOSIS — J439 Emphysema, unspecified: Secondary | ICD-10-CM

## 2024-04-13 DIAGNOSIS — J189 Pneumonia, unspecified organism: Secondary | ICD-10-CM | POA: Diagnosis not present

## 2024-04-13 DIAGNOSIS — J440 Chronic obstructive pulmonary disease with acute lower respiratory infection: Secondary | ICD-10-CM

## 2024-04-13 DIAGNOSIS — J849 Interstitial pulmonary disease, unspecified: Secondary | ICD-10-CM

## 2024-04-13 DIAGNOSIS — J4489 Other specified chronic obstructive pulmonary disease: Secondary | ICD-10-CM

## 2024-04-13 DIAGNOSIS — Z87891 Personal history of nicotine dependence: Secondary | ICD-10-CM

## 2024-04-13 MED ORDER — PREDNISONE 10 MG PO TABS: 40.0000 mg | ORAL_TABLET | Freq: Every day | ORAL | 0 refills | Status: AC

## 2024-04-13 MED ORDER — AZITHROMYCIN 250 MG PO TABS: ORAL_TABLET | ORAL | 0 refills | Status: DC

## 2024-04-13 MED ORDER — ALBUTEROL SULFATE HFA 108 (90 BASE) MCG/ACT IN AERS: 2.0000 | INHALATION_SPRAY | Freq: Four times a day (QID) | RESPIRATORY_TRACT | 6 refills | Status: DC | PRN

## 2024-04-13 NOTE — Addendum Note (Signed)
 Addended byBETHA THEOPHILUS ROOSEVELT on: 04/13/2024 01:03 PM   Modules accepted: Orders

## 2024-04-13 NOTE — Progress Notes (Signed)
 Ralph Melendez    [id number]    November 01, 1945  Primary Care Physician:Tate, Honor BROCKS, MD  Referring Physician: Corlis Honor BROCKS, MD [address]   [place],  [place] [address]  Chief complaint: Follow-up for interstitial lung disease  HPI: 78 y.o. who  has a past medical history of Carotid artery disease (HCC), GERD (gastroesophageal reflux disease), HTN (hypertension), Pancreatitis, Pancreatitis, Ruptured disk (2011), Stomach ulcer, and TIA (transient ischemic attack).   Referred for evaluation of abnormal screening CT chest He feels well with no issues.  Denies any dyspnea, cough, sputum production, congestion History notable for OSA.  He stopped using CPAP about 15 years ago. He also has interstitial lung disease, identified in 2024 . A previous CT scan showed indeterminate patterns, and a lung biopsy was considered but not performed.   Interim History Discussed the use of AI scribe software for clinical note transcription with the patient, who gave verbal consent to proceed.  He has been experiencing a severe cough since Saturday, which is so intense that it causes vomiting. The cough is described as deep and unlike anything he has experienced before. It persisted throughout Saturday, Sunday, and Monday, and continues at the time of the visit. There is no mucus production, but he experiences a 'rattling' sensation and a sore throat for a couple of days. He has not seen a doctor for this issue as his previous doctor retired, and he is currently without a primary care physician.  He has a history of interstitial lung disease and recalls a follow-up scan revealing a nodule, which he was informed had resolved based on a PET scan. He is concerned about the progression of his interstitial lung disease and is aware that there is no specific treatment for it.  He has been informed of mild cardiomegaly and was advised to undergo an echocardiogram to evaluate heart function and check for  pulmonary hypertension. However, there was confusion regarding the scheduling of this test, and it has not yet been completed.  He has COPD and uses a Breztri  inhaler twice daily, in the morning and at night. He is unsure of its effectiveness, as he still experiences difficulty breathing during the day. He notes feeling more relaxed in the evening when less active. He also uses albuterol  as a rescue inhaler when needed.  His past medical history includes multiple stomach surgeries, with the third surgery providing relief from chronic issues. He has been dealing with health problems since he was 89 years old.   Pets: No pets Occupation: Child psychotherapist, Therapist, sports Exposures: No mold, hot tub, Jacuzzi.  No feather pillows or comforters ILD questionnaire 11/10/2022-negative for any exposures. Smoking history: 55-pack-year smoker.  Quit in 2015 Travel history: No significant travel history Relevant family history: No family history of lung disease  Outpatient Encounter Medications as of 04/13/2024  Medication Sig   amLODipine  (NORVASC ) 5 MG tablet Take 5 mg by mouth daily.   budeson-glycopyrrolate-formoterol (BREZTRI  AEROSPHERE) 160-9-4.8 MCG/ACT AERO inhaler Inhale 2 puffs into the lungs in the morning and at bedtime.   clopidogrel  (PLAVIX ) 75 MG tablet Take 75 mg by mouth daily.   HYDROcodone-acetaminophen  (NORCO/VICODIN) 5-325 MG tablet Take 1 tablet by mouth every 6 (six) hours as needed for moderate pain.   ursodiol  (ACTIGALL ) 250 MG tablet Take 250 mg by mouth 2 (two) times daily.   No facility-administered encounter medications on file as of 04/13/2024.    Physical Exam:  Blood pressure 120/80, pulse 64, height 5' 7 (1.702 m), weight 194 lb 3.2 oz (88.1 kg), SpO2 95%. Gen:      No acute distress HEENT:  EOMI, sclera anicteric Neck:     No masses; no thyromegaly Lungs:    Clear to auscultation bilaterally; normal respiratory effort CV:         Regular rate and  rhythm; no murmurs Abd:      + bowel sounds; soft, non-tender; no palpable masses, no distension Ext:    No edema; adequate peripheral perfusion Neuro: alert and oriented x 3 Psych: normal mood and affect   Data Reviewed: Imaging: Screening CT 09/29/2022-fibrotic interstitial lung disease with groundglass, traction bronchiectasis, subpleural reticular densities.  Alternate pattern.    CT high-resolution 11/18/2022-mild to moderate fibrotic interstitial lung disease without clear gradient.  Indeterminate for UIP  CT high-resolution 03/01/2024-mild progression of fibrotic interstitial lung disease and indeterminate pattern,New 1.5 cm right lower lobe lung nodule  PET scan 03/09/2024-no findings of active malignancy, new left lower lobe pneumonia, 1.5 cm pulm nodule in the right lower lobe has cleared. I have reviewed the images personally.  PFTs: 01/07/2023 FVC 3.09 [25%], FEV1 2.36 [80%], F/F76, TLC 7.05 [69%], Moderate obstruction, bronchodilator response, mild restriction  02/08/2024 FVC 2.79 [68%], FEV1 2.07 [71%], F/F74, TLC 7.05 [69%], DLCO 16.04 [65%] Moderate obstruction, moderate restriction, mild diffusion defect  Labs: CTD serologies 11/10/2022-ANA 1: 80, cytoplasmic CTD serologies 02/09/2024-ANA 1: 80, cytoplasmic  CBC 02/09/2024-WBC 6.8, eos 1.8%, absolute reasonable count 122 IgE 02/09/2024-156 Alpha-1 antitrypsin 02/09/2024-147, PI MM Assessment & Plan COPD with acute bronchitis Moderate to severe COPD with an acute exacerbation likely due to bronchitis, presenting with a severe, deep cough since Saturday, without mucus production, severe enough to cause vomiting. Exacerbation may be related to a recent episode of pneumonia, although PET scan showed improvement in the nodule associated with pneumonia. - Prescribe Z-Pak (azithromycin) - Prescribe prednisone - Continue Breztri  inhaler - Order rescue inhaler (albuterol )  Lung nodule Noted on recent high-res CT.  Has been  evaluated at the lung nodule clinic by Dr. Tamea.  A follow-up PET scan shows resolution of the nodule indicating inflammatory process.  No evidence of malignancy.  Interstitial lung disease Interstitial lung disease with a pattern not typical of any specific etiology. CT shows slight worsening compared to 2024. Blood tests for autoimmune causes were borderline abnormal with elevated ANA, providing no clear diagnosis. He prefers symptomatic management due to concerns about medication side effects, such as stomach upset.  Previously discussed that multidisciplinary ILD conference in 2024 with recommendation of lung biopsy if patient is willing but he does not want undergo any invasive procedures. - Manage symptomatically as condition progresses  Mild cardiomegaly Mild enlargement of the heart. Echocardiogram is needed to evaluate heart function and assess for pulmonary hypertension, which could be secondary to lung issues. - Order echocardiogram to evaluate heart function and assess for pulmonary hypertension   Plan/Recommendations: Continue Breztri  inhaler, start albuterol  as needed Z-Pak, prednisone Echocardiogram  Lonna Coder MD [facility] 04/13/2024, 11:31 AM  CC: Corlis Honor BROCKS, MD

## 2024-04-13 NOTE — Patient Instructions (Signed)
 VISIT SUMMARY:  During your visit, we discussed your persistent and severe cough, which has been ongoing since Saturday and is causing vomiting. We reviewed your history of COPD, interstitial lung disease, and mild cardiomegaly. We also addressed your concerns about the progression of your lung disease and the need for an echocardiogram to evaluate your heart function.  YOUR PLAN:  -COPD WITH ACUTE BRONCHITIS: You have chronic obstructive pulmonary disease (COPD) with an acute episode of bronchitis, which is causing your severe cough. We will treat this with a Z-Pak (azithromycin) and prednisone. Continue using your Breztri  inhaler twice daily and your albuterol  rescue inhaler as needed.  -PNEUMONIA: You recently had pneumonia, but the nodule associated with it has resolved according to your PET scan. No further treatment is needed for this at the moment.  -INTERSTITIAL LUNG DISEASE: Interstitial lung disease involves scarring of the lung tissue, which can make it hard to breathe. Your condition has slightly worsened, but we will manage your symptoms as they arise, considering your preference to avoid medications that may upset your stomach.  -MILD CARDIOMEGALY: Mild cardiomegaly means your heart is slightly enlarged. We need to perform an echocardiogram to check your heart function and see if you have pulmonary hypertension, which could be related to your lung issues.  INSTRUCTIONS:  Please follow up with the echocardiogram as soon as it is scheduled to evaluate your heart function. Continue taking your medications as prescribed and use your inhalers as directed. If your symptoms worsen or you have any new concerns, please contact our office.

## 2024-04-25 ENCOUNTER — Ambulatory Visit: Admitting: Family Medicine

## 2024-05-01 ENCOUNTER — Ambulatory Visit: Admitting: Family Medicine

## 2024-05-01 ENCOUNTER — Ambulatory Visit: Attending: Cardiology

## 2024-05-01 DIAGNOSIS — I5030 Unspecified diastolic (congestive) heart failure: Secondary | ICD-10-CM

## 2024-05-01 DIAGNOSIS — I517 Cardiomegaly: Secondary | ICD-10-CM | POA: Diagnosis not present

## 2024-05-01 DIAGNOSIS — I361 Nonrheumatic tricuspid (valve) insufficiency: Secondary | ICD-10-CM | POA: Diagnosis not present

## 2024-05-01 LAB — ECHOCARDIOGRAM COMPLETE
AR max vel: 1.95 cm2
AV Area VTI: 2.33 cm2
AV Area mean vel: 2.11 cm2
AV Mean grad: 4 mmHg
AV Peak grad: 8.8 mmHg
Ao pk vel: 1.48 m/s
Area-P 1/2: 3.08 cm2
S' Lateral: 2.97 cm

## 2024-05-03 ENCOUNTER — Ambulatory Visit: Payer: Self-pay | Admitting: Pulmonary Disease

## 2024-07-06 ENCOUNTER — Encounter: Payer: Self-pay | Admitting: Pediatrics

## 2024-07-06 ENCOUNTER — Ambulatory Visit (INDEPENDENT_AMBULATORY_CARE_PROVIDER_SITE_OTHER): Admitting: Pediatrics

## 2024-07-06 VITALS — BP 165/77 | HR 67 | Ht 68.0 in | Wt 198.0 lb

## 2024-07-06 DIAGNOSIS — I779 Disorder of arteries and arterioles, unspecified: Secondary | ICD-10-CM

## 2024-07-06 DIAGNOSIS — J849 Interstitial pulmonary disease, unspecified: Secondary | ICD-10-CM

## 2024-07-06 DIAGNOSIS — I6529 Occlusion and stenosis of unspecified carotid artery: Secondary | ICD-10-CM | POA: Diagnosis not present

## 2024-07-06 DIAGNOSIS — M549 Dorsalgia, unspecified: Secondary | ICD-10-CM

## 2024-07-06 DIAGNOSIS — Z7689 Persons encountering health services in other specified circumstances: Secondary | ICD-10-CM

## 2024-07-06 DIAGNOSIS — I10 Essential (primary) hypertension: Secondary | ICD-10-CM

## 2024-07-06 DIAGNOSIS — G8929 Other chronic pain: Secondary | ICD-10-CM

## 2024-07-06 MED ORDER — AMLODIPINE BESYLATE 5 MG PO TABS: 5.0000 mg | ORAL_TABLET | Freq: Every day | ORAL | 3 refills | Status: DC

## 2024-07-06 NOTE — Progress Notes (Signed)
 Establish Care Note  BP (!) 165/77 (BP Location: Left Arm, Patient Position: Sitting, Cuff Size: Large)   Pulse 67   Ht 5' 8 (1.727 m)   Wt 198 lb (89.8 kg)   SpO2 93%   BMI 30.11 kg/m    Subjective:    Patient ID: Ralph Melendez, male    DOB: 11-May-1946, 78 y.o.   MRN: [id number]  HPI: Ralph Melendez is a 78 y.o. male  Chief Complaint  Patient presents with   Establish Care    Establishing care, the following was discussed today:  Discussed the use of AI scribe software for clinical note transcription with the patient, who gave verbal consent to proceed.  History of Present Illness   Ralph Melendez is a 78 year old male with interstitial lung disease who presents for follow-up care.  He has been diagnosed with interstitial lung disease (ILD) for several years and has been undergoing low-dose scans for the past six years. The most recent scan in 2024 led to a referral to a specialist in [place], confirming the diagnosis of ILD, which had been present for five years prior to diagnosis. He uses inhalers to aid in breathing and is not on home oxygen therapy.  He has a history of hypertension and is currently taking amlodipine  5 mg. He monitors his blood pressure at home, noting that his readings are generally normal.  He has been on Plavix  for many years following carotid artery surgery. A home nurse suggested discussing the continuation of Plavix  with his current provider.  He underwent back surgery in the past and uses Norco for pain management, taking it as needed. He receives a 28-day supply every three months.  He has a history of regular follow-ups with his previous provider and is accustomed to yearly visits. He has not had blood work since January 2025.        Current Outpatient Medications on File Prior to Visit  Medication Sig Dispense Refill   albuterol  (VENTOLIN  HFA) 108 (90 Base) MCG/ACT inhaler Inhale 2 puffs into the lungs every 6 (six) hours as needed for  wheezing or shortness of breath. 8 g 6   budeson-glycopyrrolate-formoterol (BREZTRI  AEROSPHERE) 160-9-4.8 MCG/ACT AERO inhaler Inhale 2 puffs into the lungs in the morning and at bedtime. 3 each 3   ursodiol  (ACTIGALL ) 300 MG capsule Take 300 mg by mouth 2 (two) times daily.     No current facility-administered medications on file prior to visit.    #HM Will review HM records and updated as needed.  Relevant past medical, surgical, family and social history reviewed and updated as indicated. Interim medical history since our last visit reviewed. Allergies and medications reviewed and updated.  ROS per HPI unless specifically indicated above     Objective:    BP (!) 165/77 (BP Location: Left Arm, Patient Position: Sitting, Cuff Size: Large)   Pulse 67   Ht 5' 8 (1.727 m)   Wt 198 lb (89.8 kg)   SpO2 93%   BMI 30.11 kg/m   Wt Readings from Last 3 Encounters:  07/06/24 198 lb (89.8 kg)  04/13/24 194 lb 3.2 oz (88.1 kg)  03/21/24 198 lb 3.2 oz (89.9 kg)     Physical Exam Constitutional:      Appearance: Normal appearance.  Pulmonary:     Effort: Pulmonary effort is normal.  Musculoskeletal:        General: Normal range of motion.  Skin:    Comments: Normal skin  color  Neurological:     General: No focal deficit present.     Mental Status: He is alert. Mental status is at baseline.  Psychiatric:        Mood and Affect: Mood normal.        Behavior: Behavior normal.        Thought Content: Thought content normal.        Assessment & Plan:  Assessment & Plan   ILD (interstitial lung disease) (HCC) Assessment & Plan: Managed with inhalers, no oxygen therapy needed. Under pulmonologist care. - Continue inhaler therapy. - Consider COVID-19 vaccination.   Primary hypertension Assessment & Plan: Elevated blood pressure likely due to anxiety. Controlled at home with amlodipine  5 mg. Potential for future increase with age. - Continue amlodipine  5 mg daily. - Monitor  blood pressure at home. - Adjust medication if uncontrolled.   Stenosis of carotid artery, unspecified laterality Carotid artery disease, unspecified laterality, unspecified type On Plavix  for stroke prevention post- endarterectomy (left 2010). No bleeding symptoms.  Wondering how long to take. Per guidelines, needs either plavix  or ASA longterm. Not taking ASA so will continue plavix . Consider switch to ASA per patient preference. - Continue Plavix  therapy.  Chronic back pain, unspecified back location, unspecified back pain laterality Assessment & Plan: Pain managed with Norco, exacerbated by activity. Current regimen is 28 tablets every three months. Discussed risk or controlled substance/opioid precautions and risks for adverse events in older adults. He understands risks and wants to continue his current regiment. - Prescribe Norco 28 tablets every three months. Will need controlled substance agreement. If needing medication adjustments, will need to go - Monitor usage and adjust dosage if needed. - Ensure prescription filled at [facility]   Encounter to establish care Reviewed available patient record including history, medications, problem list. HM updated as able. Will review and/or request outside records (if applicable) and will fill remaining HM gaps as needed at follow up visit.   Follow up plan: Return in about 4 months (around 10/23/2024) for AWV.  [name], MD

## 2024-07-06 NOTE — Patient Instructions (Signed)

## 2024-07-07 ENCOUNTER — Other Ambulatory Visit: Payer: Self-pay | Admitting: Pediatrics

## 2024-07-07 DIAGNOSIS — I10 Essential (primary) hypertension: Secondary | ICD-10-CM

## 2024-07-07 MED ORDER — AMLODIPINE BESYLATE 5 MG PO TABS: 5.0000 mg | ORAL_TABLET | Freq: Every day | ORAL | 1 refills | Status: DC

## 2024-07-07 NOTE — Telephone Encounter (Signed)
 Requested Prescriptions  Pending Prescriptions Disp Refills   amLODipine  (NORVASC ) 5 MG tablet 90 tablet 1    Sig: Take 1 tablet (5 mg total) by mouth daily.     Cardiovascular: Calcium  Channel Blockers 2 Failed - 07/07/2024  5:53 PM      Failed - Last BP in normal range    BP Readings from Last 1 Encounters:  07/06/24 (!) 165/77         Passed - Last Heart Rate in normal range    Pulse Readings from Last 1 Encounters:  07/06/24 67         Passed - Valid encounter within last 6 months    Recent Outpatient Visits           Yesterday ILD (interstitial lung disease) [facility])   [facility] Herold Hadassah SQUIBB, MD

## 2024-07-07 NOTE — Telephone Encounter (Signed)
 Copied from CRM [id number]. Topic: Clinical - Medication Refill >> Jul 07, 2024  2:05 PM Donee H wrote: Medication: amLODipine  (NORVASC ) 5 MG tablet  Has the patient contacted their pharmacy? Yes, medication was sent to incorrect pharmacy would like for it to be sent to [facility]  This is the patient's preferred pharmacy:  [id number] [place], [place] - [address] AT [facility] [address] [place] [address] [place] [address] Phone: [phone] Fax: [phone]   Is this the correct pharmacy for this prescription? Yes   Has the prescription been filled recently? No  Is the patient out of the medication? Yes  Has the patient been seen for an appointment in the last year OR does the patient have an upcoming appointment? Yes  Can we respond through MyChart? Yes  Agent: Please be advised that Rx refills may take up to 3 business days. We ask that you follow-up with your pharmacy.

## 2024-07-10 ENCOUNTER — Telehealth: Payer: Self-pay | Admitting: Pediatrics

## 2024-07-10 NOTE — Telephone Encounter (Signed)
 Prescription Request  07/10/2024  LOV: 07/06/2024  What is the name of the medication or equipment? clopidogrel  (PLAVIX ) 75 MG tablet   Send To: [facility] - [place], [place] - [address] [place] [address] Phone: [phone] Fax: [phone]   Hydrocodone  5-325mg   Send To: [id number] [place], [place] - [address] AT [facility] [address] [place] [address] [place] [address] Phone: [phone] Fax: [phone]  Have you contacted your pharmacy to request a refill? No   Which pharmacy would you like this sent to?    Patient notified that their request is being sent to the clinical staff for review and that they should receive a response within 2 business days.   Please advise at Mobile [phone] (mobile)

## 2024-07-13 ENCOUNTER — Other Ambulatory Visit: Payer: Self-pay | Admitting: Pediatrics

## 2024-07-13 DIAGNOSIS — G8929 Other chronic pain: Secondary | ICD-10-CM

## 2024-07-13 DIAGNOSIS — I779 Disorder of arteries and arterioles, unspecified: Secondary | ICD-10-CM

## 2024-07-13 MED ORDER — HYDROCODONE-ACETAMINOPHEN 5-325 MG PO TABS: 1.0000 | ORAL_TABLET | Freq: Four times a day (QID) | ORAL | 0 refills | Status: AC | PRN

## 2024-07-13 MED ORDER — CLOPIDOGREL BISULFATE 75 MG PO TABS: 75.0000 mg | ORAL_TABLET | Freq: Every day | ORAL | 3 refills | Status: DC

## 2024-07-13 NOTE — Progress Notes (Signed)
 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 Uses 28 tabs every 3-4months. Needs controlled substance agreement signed at follow up visit.   Ralph SHAUNNA Nett, MD

## 2024-07-14 NOTE — Telephone Encounter (Signed)
 Noted

## 2024-07-18 ENCOUNTER — Encounter: Payer: Self-pay | Admitting: Pediatrics

## 2024-07-18 DIAGNOSIS — H40013 Open angle with borderline findings, low risk, bilateral: Secondary | ICD-10-CM | POA: Diagnosis not present

## 2024-07-18 DIAGNOSIS — H2513 Age-related nuclear cataract, bilateral: Secondary | ICD-10-CM | POA: Diagnosis not present

## 2024-07-18 DIAGNOSIS — H33302 Unspecified retinal break, left eye: Secondary | ICD-10-CM | POA: Diagnosis not present

## 2024-07-18 DIAGNOSIS — G8929 Other chronic pain: Secondary | ICD-10-CM | POA: Insufficient documentation

## 2024-07-18 DIAGNOSIS — H43812 Vitreous degeneration, left eye: Secondary | ICD-10-CM | POA: Diagnosis not present

## 2024-07-18 NOTE — Assessment & Plan Note (Signed)
 Managed with inhalers, no oxygen therapy needed. Under pulmonologist care. - Continue inhaler therapy. - Consider COVID-19 vaccination.

## 2024-07-18 NOTE — Assessment & Plan Note (Signed)
 Elevated blood pressure likely due to anxiety. Controlled at home with amlodipine  5 mg. Potential for future increase with age. - Continue amlodipine  5 mg daily. - Monitor blood pressure at home. - Adjust medication if uncontrolled.

## 2024-07-18 NOTE — Assessment & Plan Note (Addendum)
 Pain managed with Norco, exacerbated by activity. Current regimen is 28 tablets every three months. Discussed risk or controlled substance/opioid precautions and risks for adverse events in older adults. He understands risks and wants to continue his current regiment. - Prescribe Norco 28 tablets every three months. Will need controlled substance agreement. If needing medication adjustments, will need to go - Monitor usage and adjust dosage if needed. - Ensure prescription filled at [facility].

## 2024-07-27 ENCOUNTER — Encounter: Payer: Self-pay | Admitting: Family Medicine

## 2024-08-08 ENCOUNTER — Ambulatory Visit: Payer: Self-pay

## 2024-08-08 NOTE — Telephone Encounter (Signed)
 FYI Only or Action Required?: FYI only for provider.  Patient was last seen in primary care on 07/06/2024 by Herold Hadassah SQUIBB, MD.  Called Nurse Triage reporting Mass.  Symptoms began about a month ago.  Interventions attempted: Nothing.  Symptoms are: gradually worsening.  Triage Disposition: See PCP When Office is Open (Within 3 Days)  Patient/caregiver understands and will follow disposition?: Yes  Copied from CRM #[id number]. Topic: Clinical - Red Word Triage >> Aug 08, 2024  2:02 PM Rosaria BRAVO wrote: Red Word that prompted transfer to Nurse Triage:  Lump behind knee, swelling, painful Reason for Disposition  [1] Small swelling or lump AND [2] unexplained AND [3] present > 1 week  Answer Assessment - Initial Assessment Questions 1. APPEARANCE of SWELLING: What does it look like?     Flesh colored lump under skin at the top of left leg on the back 2. SIZE: How large is the swelling? (e.g., inches, cm; or compare to size of pinhead, tip of pen, eraser, coin, pea, grape, ping pong ball)      Quarter sized 3. LOCATION: Where is the swelling located?     Top of back of left leg 4. ONSET: When did the swelling start?     5 weeks ago 5. COLOR: What color is it? Is there more than one color?     Minimally darker than his skin 6. PAIN: Is there any pain? If Yes, ask: How bad is the pain? (Scale 1-10; or mild, moderate, severe)       Painful to sit 7. ITCH: Does it itch? If Yes, ask: How bad is the itch?      denies 8. CAUSE: What do you think caused the swelling?     unknown 9 OTHER SYMPTOMS: Do you have any other symptoms? (e.g., fever)     denies  Protocols used: Skin Lump or Localized Swelling-A-AH

## 2024-08-14 ENCOUNTER — Other Ambulatory Visit: Payer: Self-pay

## 2024-08-14 ENCOUNTER — Ambulatory Visit: Admitting: Nurse Practitioner

## 2024-08-14 ENCOUNTER — Ambulatory Visit (INDEPENDENT_AMBULATORY_CARE_PROVIDER_SITE_OTHER): Admitting: Nurse Practitioner

## 2024-08-14 ENCOUNTER — Encounter: Payer: Self-pay | Admitting: Nurse Practitioner

## 2024-08-14 VITALS — BP 167/71 | HR 65 | Temp 97.9°F | Ht 67.5 in | Wt 199.8 lb

## 2024-08-14 DIAGNOSIS — L089 Local infection of the skin and subcutaneous tissue, unspecified: Secondary | ICD-10-CM | POA: Diagnosis not present

## 2024-08-14 DIAGNOSIS — I10 Essential (primary) hypertension: Secondary | ICD-10-CM

## 2024-08-14 MED ORDER — DOXYCYCLINE HYCLATE 100 MG PO TABS
100.0000 mg | ORAL_TABLET | Freq: Two times a day (BID) | ORAL | 0 refills | Status: DC
  Filled 2024-08-14: qty 20, 10d supply, fill #0

## 2024-08-14 MED ORDER — AMLODIPINE BESYLATE 5 MG PO TABS: 5.0000 mg | ORAL_TABLET | Freq: Every day | ORAL | 1 refills | Status: DC

## 2024-08-14 NOTE — Assessment & Plan Note (Signed)
 Elevated blood pressure likely due to anxiety/pain. Controlled at home with amlodipine  5 mg. Potential for future increase with age. - Continue amlodipine  5 mg daily, refill sent today. - Monitor blood pressure at home. - Adjust medication if uncontrolled.

## 2024-08-14 NOTE — Progress Notes (Signed)
 BP (!) 167/71   Pulse 65   Temp 97.9 F (36.6 C) (Oral)   Ht 5' 7.5 (1.715 m)   Wt 199 lb 12.8 oz (90.6 kg)   SpO2 95%   BMI 30.83 kg/m    Subjective:    Patient ID: Ralph Melendez, male    DOB: 09/06/1946, 78 y.o.   MRN: [id number]    Chief Complaint  Patient presents with   Mass    Patient states he noticed a lump at the top of his left leg near his buttock about 3 weeks ago. Wife states that she noticed some blood on the commode seat so they think it may have drained a little bit    HPI:  Ralph Melendez is a 78 y.o. male here for a painful raised lump for about a week, pt states last night it scabbed over but denies drainage. Pt states it has stayed about the same size, it is red and tender. Pt denies any known bug bites or injury to the area. Pt denies fevers. Pt denies rash or other lesions.  Relevant past medical, surgical, family and social history reviewed and updated as indicated. Interim medical history since our last visit reviewed. Allergies and medications reviewed and updated.  Review of Systems  Constitutional:  Negative for chills, fatigue and fever.  HENT:  Negative for congestion and rhinorrhea.   Eyes:  Negative for visual disturbance.  Respiratory:  Negative for chest tightness and shortness of breath.   Cardiovascular:  Negative for chest pain, palpitations and leg swelling.  Gastrointestinal:  Negative for abdominal pain, diarrhea and nausea.  Musculoskeletal:  Negative for arthralgias and myalgias.  Skin:  Positive for wound. Negative for rash.       Groin  Neurological:  Negative for dizziness, weakness, light-headedness and headaches.  Psychiatric/Behavioral:  Negative for confusion.     Per HPI unless specifically indicated above     Objective:    BP (!) 167/71   Pulse 65   Temp 97.9 F (36.6 C) (Oral)   Ht 5' 7.5 (1.715 m)   Wt 199 lb 12.8 oz (90.6 kg)   SpO2 95%   BMI 30.83 kg/m   Wt Readings from Last 3 Encounters:  08/14/24 199  lb 12.8 oz (90.6 kg)  07/06/24 198 lb (89.8 kg)  04/13/24 194 lb 3.2 oz (88.1 kg)    Physical Exam Vitals and nursing note reviewed.  Constitutional:      General: He is not in acute distress.    Appearance: He is not ill-appearing, toxic-appearing or diaphoretic.  HENT:     Head: Normocephalic.     Right Ear: External ear normal.     Left Ear: External ear normal.     Nose: Nose normal.     Mouth/Throat:     Mouth: Mucous membranes are moist.     Pharynx: No oropharyngeal exudate or posterior oropharyngeal erythema.  Eyes:     General:        Right eye: No discharge.        Left eye: No discharge.     Extraocular Movements: Extraocular movements intact.     Conjunctiva/sclera: Conjunctivae normal.     Pupils: Pupils are equal, round, and reactive to light.  Cardiovascular:     Rate and Rhythm: Normal rate and regular rhythm.     Pulses: Normal pulses.     Heart sounds: Normal heart sounds. No murmur heard. Pulmonary:     Effort: Pulmonary effort  is normal. No respiratory distress.     Breath sounds: Normal breath sounds. No wheezing, rhonchi or rales.  Abdominal:     General: Abdomen is flat. Bowel sounds are normal.     Palpations: Abdomen is soft.  Musculoskeletal:     Cervical back: Normal range of motion and neck supple.  Skin:    General: Skin is warm and dry.     Capillary Refill: Capillary refill takes less than 2 seconds.     Findings: Abscess and erythema present. No rash.      Neurological:     General: No focal deficit present.     Mental Status: He is oriented to person, place, and time.  Psychiatric:        Mood and Affect: Mood normal.        Behavior: Behavior normal.        Thought Content: Thought content normal.        Judgment: Judgment normal.     Results for orders placed or performed in visit on 07/27/24  Medicare Annual Wellness Visits   Collection Time: 11/18/23 12:00 AM  Result Value Ref Range   HM Annual Wellness Visit See Report (in  chart)       Assessment & Plan:   Problem List Items Addressed This Visit       Cardiovascular and Mediastinum   HTN (hypertension)   Elevated blood pressure likely due to anxiety/pain. Controlled at home with amlodipine  5 mg. Potential for future increase with age. - Continue amlodipine  5 mg daily, refill sent today. - Monitor blood pressure at home. - Adjust medication if uncontrolled.      Relevant Medications   amLODipine  (NORVASC ) 5 MG tablet   Other Visit Diagnoses       Skin infection    -  Primary   Doxycycline prescribed. Apply warm compress to aid with drainage. Follow-up in 1 week, sooner if concerns arise.        Follow up plan: Return in about 1 week (around 08/21/2024) for [facility] check.

## 2024-08-21 ENCOUNTER — Ambulatory Visit (INDEPENDENT_AMBULATORY_CARE_PROVIDER_SITE_OTHER): Admitting: Nurse Practitioner

## 2024-08-21 ENCOUNTER — Encounter: Payer: Self-pay | Admitting: Nurse Practitioner

## 2024-08-21 VITALS — BP 178/95 | HR 64 | Temp 97.8°F | Ht 67.5 in | Wt 199.6 lb

## 2024-08-21 DIAGNOSIS — I10 Essential (primary) hypertension: Secondary | ICD-10-CM | POA: Diagnosis not present

## 2024-08-21 DIAGNOSIS — L089 Local infection of the skin and subcutaneous tissue, unspecified: Secondary | ICD-10-CM

## 2024-08-21 MED ORDER — AMLODIPINE BESYLATE 10 MG PO TABS: 10.0000 mg | ORAL_TABLET | Freq: Every day | ORAL | 1 refills | Status: DC

## 2024-08-21 NOTE — Progress Notes (Signed)
 BP (!) 178/95   Pulse 64   Temp 97.8 F (36.6 C) (Oral)   Ht 5' 7.5 (1.715 m)   Wt 199 lb 9.6 oz (90.5 kg)   SpO2 97%   BMI 30.80 kg/m    Subjective:    Patient ID: Ralph Melendez, male    DOB: October 13, 1946, 78 y.o.   MRN: [id number]   NOTE WRITTEN BY DNP STUDENT.  ASSESSMENT AND PLAN OF CARE REVIEWED WITH STUDENT, AGREE WITH ABOVE FINDINGS AND PLAN.  Chief Complaint  Patient presents with   Wound Check   HPI:  Ralph Melendez is a 78 y.o. male here for a follow-up for a skin infection on his inner L thigh. Still has 3 days left of doxycyline. Pt reports that wound opened up and drained. Reports improved pain and healing. Denies fevers, worsening pain, or enlargement of wound. BP has been elevated at last 3 visits 150s-170s/80s-90s. Pt denies headaches, chest pain, or lightheadedness.  Relevant past medical, surgical, family and social history reviewed and updated as indicated. Interim medical history since our last visit reviewed. Allergies and medications reviewed and updated.  Review of Systems  Constitutional:  Negative for chills, fatigue and fever.  HENT:  Negative for congestion and rhinorrhea.   Eyes:  Negative for visual disturbance.  Respiratory:  Negative for chest tightness and shortness of breath.   Cardiovascular:  Negative for chest pain, palpitations and leg swelling.  Gastrointestinal:  Negative for abdominal pain, diarrhea and nausea.  Musculoskeletal:  Negative for arthralgias and myalgias.  Skin:  Positive for wound. Negative for rash.       Groin  Neurological:  Negative for dizziness, weakness, light-headedness and headaches.  Psychiatric/Behavioral:  Negative for confusion.     Per HPI unless specifically indicated above     Objective:    BP (!) 178/95   Pulse 64   Temp 97.8 F (36.6 C) (Oral)   Ht 5' 7.5 (1.715 m)   Wt 199 lb 9.6 oz (90.5 kg)   SpO2 97%   BMI 30.80 kg/m   Wt Readings from Last 3 Encounters:  08/21/24 199 lb 9.6 oz (90.5  kg)  08/14/24 199 lb 12.8 oz (90.6 kg)  07/06/24 198 lb (89.8 kg)    Physical Exam Vitals and nursing note reviewed.  Constitutional:      General: He is not in acute distress.    Appearance: He is not ill-appearing, toxic-appearing or diaphoretic.  HENT:     Head: Normocephalic.     Right Ear: External ear normal.     Left Ear: External ear normal.     Nose: Nose normal.     Mouth/Throat:     Mouth: Mucous membranes are moist.     Pharynx: No oropharyngeal exudate or posterior oropharyngeal erythema.  Eyes:     General:        Right eye: No discharge.        Left eye: No discharge.     Extraocular Movements: Extraocular movements intact.     Conjunctiva/sclera: Conjunctivae normal.     Pupils: Pupils are equal, round, and reactive to light.  Cardiovascular:     Rate and Rhythm: Normal rate and regular rhythm.     Pulses: Normal pulses.     Heart sounds: Normal heart sounds. No murmur heard. Pulmonary:     Effort: Pulmonary effort is normal. No respiratory distress.     Breath sounds: Normal breath sounds. No wheezing, rhonchi or rales.  Abdominal:  General: Abdomen is flat. Bowel sounds are normal.     Palpations: Abdomen is soft.  Musculoskeletal:     Cervical back: Normal range of motion and neck supple.  Skin:    General: Skin is warm and dry.     Capillary Refill: Capillary refill takes less than 2 seconds.     Findings: Abscess and erythema present. No rash.      Neurological:     General: No focal deficit present.     Mental Status: He is oriented to person, place, and time.  Psychiatric:        Mood and Affect: Mood normal.        Behavior: Behavior normal.        Thought Content: Thought content normal.        Judgment: Judgment normal.     Results for orders placed or performed in visit on 07/27/24  Medicare Annual Wellness Visits   Collection Time: 11/18/23 12:00 AM  Result Value Ref Range   HM Annual Wellness Visit See Report (in chart)        Assessment & Plan:   Problem List Items Addressed This Visit       Cardiovascular and Mediastinum   HTN (hypertension) - Primary   Elevated blood pressure. Increase amlodipine  from 5mg  to 10mg  daily. Keep appt for January for reevaluation, sooner if concerns arise. - Continue amlodipine  10 mg daily, new prescription sent today. - Monitor blood pressure at home. - Adjust medication if uncontrolled.      Relevant Medications   amLODipine  (NORVASC ) 10 MG tablet   Other Visit Diagnoses       Skin infection       Continue last 3 days of doxycycline. Follow-up as needed if symptoms return or worsen.         Follow up plan: No follow-ups on file.

## 2024-08-21 NOTE — Assessment & Plan Note (Addendum)
 Elevated blood pressure. Increase amlodipine  from 5mg  to 10mg  daily. Keep appt for January for reevaluation, sooner if concerns arise. - Continue amlodipine  10 mg daily, new prescription sent today. - Monitor blood pressure at home. - Adjust medication if uncontrolled.

## 2024-11-01 ENCOUNTER — Ambulatory Visit: Admitting: Nurse Practitioner

## 2024-11-01 ENCOUNTER — Ambulatory Visit: Admitting: Pediatrics

## 2024-11-06 ENCOUNTER — Ambulatory Visit (INDEPENDENT_AMBULATORY_CARE_PROVIDER_SITE_OTHER): Admitting: Nurse Practitioner

## 2024-11-06 ENCOUNTER — Encounter: Payer: Self-pay | Admitting: Nurse Practitioner

## 2024-11-06 VITALS — BP 138/88 | HR 98 | Temp 97.8°F | Ht 67.52 in | Wt 198.0 lb

## 2024-11-06 DIAGNOSIS — I10 Essential (primary) hypertension: Secondary | ICD-10-CM

## 2024-11-06 DIAGNOSIS — G8929 Other chronic pain: Secondary | ICD-10-CM

## 2024-11-06 DIAGNOSIS — I779 Disorder of arteries and arterioles, unspecified: Secondary | ICD-10-CM | POA: Diagnosis not present

## 2024-11-06 DIAGNOSIS — J849 Interstitial pulmonary disease, unspecified: Secondary | ICD-10-CM | POA: Diagnosis not present

## 2024-11-06 DIAGNOSIS — Z0000 Encounter for general adult medical examination without abnormal findings: Secondary | ICD-10-CM

## 2024-11-06 DIAGNOSIS — M549 Dorsalgia, unspecified: Secondary | ICD-10-CM

## 2024-11-06 MED ORDER — BREZTRI AEROSPHERE 160-9-4.8 MCG/ACT IN AERO: 2.0000 | INHALATION_SPRAY | Freq: Two times a day (BID) | RESPIRATORY_TRACT | 3 refills | Status: AC

## 2024-11-06 MED ORDER — URSODIOL 300 MG PO CAPS: 300.0000 mg | ORAL_CAPSULE | Freq: Two times a day (BID) | ORAL | 1 refills | Status: AC

## 2024-11-06 MED ORDER — CLOPIDOGREL BISULFATE 75 MG PO TABS: 75.0000 mg | ORAL_TABLET | Freq: Every day | ORAL | 3 refills | Status: AC

## 2024-11-06 MED ORDER — HYDROCODONE-ACETAMINOPHEN 5-325 MG PO TABS: 1.0000 | ORAL_TABLET | Freq: Four times a day (QID) | ORAL | 0 refills | Status: AC | PRN

## 2024-11-06 MED ORDER — AMLODIPINE BESYLATE 10 MG PO TABS: 10.0000 mg | ORAL_TABLET | Freq: Every day | ORAL | 1 refills | Status: AC

## 2024-11-06 MED ORDER — ALBUTEROL SULFATE HFA 108 (90 BASE) MCG/ACT IN AERS: 2.0000 | INHALATION_SPRAY | Freq: Four times a day (QID) | RESPIRATORY_TRACT | 6 refills | Status: AC | PRN

## 2024-11-06 NOTE — Assessment & Plan Note (Signed)
 Chronic.  Controlled.  Continue with current medication regimen.  Labs ordered today.  Return to clinic in 6 months for reevaluation.  Call sooner if concerns arise.  ? ?

## 2024-11-06 NOTE — Assessment & Plan Note (Signed)
 Chronic. Elevated at visit today.  Checks regularly at home.  Well controlled in 120-130/80s at home.  Continue with Amlodipine  10mg  daily.  Follow up in 3 months.  Call sooner if concerns arise.

## 2024-11-06 NOTE — Assessment & Plan Note (Signed)
 Managed with inhalers, no oxygen therapy needed. Under pulmonologist care. - Continue inhaler therapy of Breztri . - Refilled Albuterol  and Breztri .

## 2024-11-06 NOTE — Progress Notes (Signed)
 "  Chief Complaint  Patient presents with   Medicare Wellness     Subjective:   Ralph Melendez is a 79 y.o. male who presents for a Medicare Annual Wellness Visit.  Visit info / Clinical Intake: Living arrangements:: (Patient-Rptd) lives with spouse/significant other Patient's Overall Health Status Rating: (!) (Patient-Rptd) fair Typical amount of pain: (Patient-Rptd) some Does pain affect daily life?: (!) (Patient-Rptd) yes  Dietary Habits and Nutritional Risks How many meals a day?: (Patient-Rptd) 2 Eats fruit and vegetables daily?: (Patient-Rptd) yes Most meals are obtained by: (Patient-Rptd) preparing own meals  Functional Status Activities of Daily Living (to include ambulation/medication): (Patient-Rptd) Independent Ambulation: (Patient-Rptd) Independent Medication Administration: (Patient-Rptd) Independent Home Management (perform basic housework or laundry): (Patient-Rptd) Independent Manage your own finances?: (!) (Patient-Rptd) no Primary transportation is: (Patient-Rptd) driving  Fall Screening Falls in the past year?: (Patient-Rptd) 0 Number of falls in past year: 0 Was there an injury with Fall?: 0 Fall Risk Category Calculator: 0 Patient Fall Risk Level: Low Fall Risk  Fall Risk Patient at Risk for Falls Due to: Impaired balance/gait; Impaired mobility  Home and Transportation Safety: All rugs have non-skid backing?: (Patient-Rptd) yes All stairs or steps have railings?: (Patient-Rptd) yes Grab bars in the bathtub or shower?: (Patient-Rptd) yes Have non-skid surface in bathtub or shower?: (Patient-Rptd) yes Good home lighting?: (Patient-Rptd) yes Regular seat belt use?: (Patient-Rptd) yes Hospital stays in the last year:: (Patient-Rptd) no  Cognitive Assessment Difficulty concentrating, remembering, or making decisions? : (Patient-Rptd) no    Allergies (verified) Contrast media [iodinated contrast media] and Shrimp [shellfish allergy]   Current  Medications (verified) Outpatient Encounter Medications as of 11/06/2024  Medication Sig   albuterol  (VENTOLIN  HFA) 108 (90 Base) MCG/ACT inhaler Inhale 2 puffs into the lungs every 6 (six) hours as needed for wheezing or shortness of breath.   amLODipine  (NORVASC ) 10 MG tablet Take 1 tablet (10 mg total) by mouth daily.   budeson-glycopyrrolate-formoterol (BREZTRI  AEROSPHERE) 160-9-4.8 MCG/ACT AERO inhaler Inhale 2 puffs into the lungs in the morning and at bedtime.   clopidogrel  (PLAVIX ) 75 MG tablet Take 1 tablet (75 mg total) by mouth daily.   doxycycline  (VIBRA -TABS) 100 MG tablet Take 1 tablet (100 mg total) by mouth 2 (two) times daily.   ursodiol  (ACTIGALL ) 300 MG capsule Take 300 mg by mouth 2 (two) times daily.   No facility-administered encounter medications on file as of 11/06/2024.    History: Past Medical History:  Diagnosis Date   Allergy 1978   Arthritis 2025   Carotid artery disease    Cataract    COPD (chronic obstructive pulmonary disease) (HCC)    Emphysema of lung (HCC) 2018   Gallbladder & bile duct stone with obstruction 08/02/2020   GERD (gastroesophageal reflux disease)    HTN (hypertension)    ILD (interstitial lung disease) (HCC)    10/2022   Pancreatitis    Pancreatitis    Ruptured disk 2011   Sleep apnea 1988   Stomach ulcer    TIA (transient ischemic attack)    Past Surgical History:  Procedure Laterality Date   BACK SURGERY  2011   carotid  2008   cleaning   CHOLECYSTECTOMY  2020   COLONOSCOPY  2005   EYE SURGERY  2018   NECK SURGERY     SPINE SURGERY  2014   STOMACH SURGERY  [phone]   UPPER GI ENDOSCOPY     x 3   Family History  Problem Relation Age of Onset  Breast cancer Mother    Cancer Mother    Heart disease Mother    Obesity Mother    Stroke Mother    Alcohol abuse Father    COPD Father    Social History   Occupational History   Not on file  Tobacco Use   Smoking status: Former    Current packs/day: 0.00     Average packs/day: 1 pack/day for 56.0 years (56.0 ttl pk-yrs)    Types: Cigarettes    Start date: 10/07/1958    Quit date: 10/07/2014    Years since quitting: 10.0   Smokeless tobacco: Never  Substance and Sexual Activity   Alcohol use: No    Alcohol/week: 0.0 standard drinks of alcohol   Drug use: No   Sexual activity: Not Currently   Tobacco Counseling Counseling given: Not Answered  SDOH Screenings   Food Insecurity: No Food Insecurity (08/11/2024)  Housing: Low Risk (08/11/2024)  Transportation Needs: No Transportation Needs (08/11/2024)  Financial Resource Strain: Low Risk (08/11/2024)  Physical Activity: Insufficiently Active (08/11/2024)  Social Connections: Moderately Isolated (08/11/2024)  Stress: No Stress Concern Present (08/11/2024)  Tobacco Use: Medium Risk (11/06/2024)   See flowsheets for full screening details  Depression Screen     Goals Addressed   None          Objective:    Today's Vitals   11/06/24 1437  TempSrc: Oral  Height: 5' 7.52 (1.715 m)   Body mass index is 30.78 kg/m.  Hearing/Vision screen No results found. Immunizations and Health Maintenance Health Maintenance  Topic Date Due   Hepatitis C Screening  Never done   DTaP/Tdap/Td (1 - Tdap) Never done   Zoster Vaccines- Shingrix (1 of 2) Never done   COVID-19 Vaccine (3 - 2025-26 season) 06/19/2024   Influenza Vaccine  01/16/2025 (Originally 05/19/2024)   Pneumococcal Vaccine: 50+ Years (1 of 2 - PCV) 03/06/2025 (Originally 11/12/1964)   Lung Cancer Screening  02/17/2025   Medicare Annual Wellness (AWV)  11/06/2025   Meningococcal B Vaccine  Aged Out   Colonoscopy  Discontinued        Assessment/Plan:  This is a routine wellness examination for Ralph Melendez.  Patient Care Team: Melvin Pao, NP as PCP - General (Nurse Practitioner) Dessa Reyes ORN, MD (General Surgery) Corlis Honor BROCKS, MD (Inactive) (Internal Medicine) Mannam, Praveen, MD as Consulting Physician  (Pulmonary Disease)  I have personally reviewed and noted the following in the patients chart:   Medical and social history Use of alcohol, tobacco or illicit drugs  Current medications and supplements including opioid prescriptions. Functional ability and status Nutritional status Physical activity Advanced directives List of other physicians Hospitalizations, surgeries, and ER visits in previous 12 months Vitals Screenings to include cognitive, depression, and falls Referrals and appointments  No orders of the defined types were placed in this encounter.  In addition, I have reviewed and discussed with patient certain preventive protocols, quality metrics, and best practice recommendations. A written personalized care plan for preventive services as well as general preventive health recommendations were provided to patient.   Ralph Melendez, CMA   11/06/2024   No follow-ups on file.  After Visit Summary: (In Person-Declined) Patient declined AVS at this time.   "

## 2024-11-06 NOTE — Progress Notes (Signed)
 "  BP 138/88 Comment: home blood pressure reading  Pulse 98   Temp 97.8 F (36.6 C) (Oral)   Ht 5' 7.52 (1.715 m)   Wt 198 lb (89.8 kg)   SpO2 95%   BMI 30.54 kg/m    Subjective:    Patient ID: Ralph Melendez, male    DOB: 1946/07/17, 79 y.o.   MRN: [id number]  HPI: Ralph Melendez is a 79 y.o. male presenting on 11/06/2024 for comprehensive medical examination. Current medical complaints include:none  He currently lives with: Interim Problems from his last visit: no  HYPERTENSION / HYPERLIPIDEMIA Currently taking Amlodipine  10mg  Satisfied with current treatment? no Duration of hypertension: years BP monitoring frequency: daily BP range: 138/88 BP medication side effects: no Past BP meds: amlodipine  Duration of hyperlipidemia: years Cholesterol medication side effects: no Cholesterol supplements: none Past cholesterol medications: none Medication compliance: excellent compliance Aspirin: no Recent stressors: no Recurrent headaches: no Visual changes: no Palpitations: no Dyspnea: no Chest pain: no Lower extremity edema: no Dizzy/lightheaded: no  BACK PAIN Patient states he was getting Norco 5mg /325mg  28 pills every 3 months.  He is only using the medication as needed.  Has a difficult time moving around without the medication.  He has had surgery in the past.    COPD COPD status: controlled Satisfied with current treatment?: yes Oxygen use: no Dyspnea frequency:  Cough frequency:  Rescue inhaler frequency:  rarely Limitation of activity: no Productive cough:  Last Spirometry:  Pneumovax: Up to Date Influenza: Up to Date   Depression Screen done today and results listed below:     11/06/2024    3:10 PM  Depression screen PHQ 2/9  Decreased Interest 0  Down, Depressed, Hopeless 0  PHQ - 2 Score 0  Altered sleeping 0  Tired, decreased energy 1  Change in appetite 0  Feeling bad or failure about yourself  0  Trouble concentrating 0  Moving slowly or  fidgety/restless 0  Suicidal thoughts 0  PHQ-9 Score 1  Difficult doing work/chores Not difficult at all    The patient does not have a history of falls. I did complete a risk assessment for falls. A plan of care for falls was documented.   Past Medical History:  Past Medical History:  Diagnosis Date   Allergy 1978   Arthritis 2025   Carotid artery disease    Cataract    COPD (chronic obstructive pulmonary disease) (HCC)    Emphysema of lung (HCC) 2018   Gallbladder & bile duct stone with obstruction 08/02/2020   GERD (gastroesophageal reflux disease)    HTN (hypertension)    ILD (interstitial lung disease) (HCC)    10/2022   Pancreatitis    Pancreatitis    Ruptured disk 2011   Sleep apnea 1988   Stomach ulcer    TIA (transient ischemic attack)     Surgical History:  Past Surgical History:  Procedure Laterality Date   BACK SURGERY  2011   carotid  2008   cleaning   CHOLECYSTECTOMY  2020   COLONOSCOPY  2005   EYE SURGERY  2018   NECK SURGERY     SPINE SURGERY  2014   STOMACH SURGERY  [id number]   UPPER GI ENDOSCOPY     x 3    Medications:  No current outpatient medications on file prior to visit.   No current facility-administered medications on file prior to visit.    Allergies:  Allergies[1]  Social History:  Social History  Socioeconomic History   Marital status: Married    Spouse name: Not on file   Number of children: Not on file   Years of education: Not on file   Highest education level: 6th grade  Occupational History   Not on file  Tobacco Use   Smoking status: Former    Current packs/day: 0.00    Average packs/day: 1 pack/day for 56.0 years (56.0 ttl pk-yrs)    Types: Cigarettes    Start date: 10/07/1958    Quit date: 10/07/2014    Years since quitting: 10.0   Smokeless tobacco: Never  Substance and Sexual Activity   Alcohol use: No    Alcohol/week: 0.0 standard drinks of alcohol   Drug use: No   Sexual activity: Not Currently   Other Topics Concern   Not on file  Social History Narrative   Not on file   Social Drivers of Health   Tobacco Use: Medium Risk (11/06/2024)   Patient History    Smoking Tobacco Use: Former    Smokeless Tobacco Use: Never    Passive Exposure: Not on Actuary Strain: Low Risk (08/11/2024)   Overall Financial Resource Strain (CARDIA)    Difficulty of Paying Living Expenses: Not hard at all  Food Insecurity: No Food Insecurity (11/06/2024)   Epic    Worried About Programme Researcher, Broadcasting/film/video in the Last Year: Never true    Ran Out of Food in the Last Year: Never true  Transportation Needs: No Transportation Needs (11/06/2024)   Epic    Lack of Transportation (Medical): No    Lack of Transportation (Non-Medical): No  Physical Activity: Inactive (11/06/2024)   Exercise Vital Sign    Days of Exercise per Week: 0 days    Minutes of Exercise per Session: 0 min  Stress: No Stress Concern Present (11/06/2024)   [employer] of Occupational Health - Occupational Stress Questionnaire    Feeling of Stress: Only a little  Social Connections: Moderately Isolated (11/06/2024)   Social Connection and Isolation Panel    Frequency of Communication with Friends and Family: More than three times a week    Frequency of Social Gatherings with Friends and Family: Once a week    Attends Religious Services: Never    Database Administrator or Organizations: No    Attends Banker Meetings: Never    Marital Status: Married  Catering Manager Violence: Not At Risk (11/06/2024)   Epic    Fear of Current or Ex-Partner: No    Emotionally Abused: No    Physically Abused: No    Sexually Abused: No  Depression (PHQ2-9): Low Risk (11/06/2024)   Depression (PHQ2-9)    PHQ-2 Score: 1  Alcohol Screen: Not on file  Housing: Low Risk (11/06/2024)   Epic    Unable to Pay for Housing in the Last Year: No    Number of Times Moved in the Last Year: 0    Homeless in the Last Year: No   Utilities: Not At Risk (11/06/2024)   Epic    Threatened with loss of utilities: No  Health Literacy: Inadequate Health Literacy (11/06/2024)   B1300 Health Literacy    Frequency of need for help with medical instructions: Always   Tobacco Use History[2] Social History   Substance and Sexual Activity  Alcohol Use No   Alcohol/week: 0.0 standard drinks of alcohol    Family History:  Family History  Problem Relation Age of Onset   Breast cancer  Mother    Cancer Mother    Heart disease Mother    Obesity Mother    Stroke Mother    Alcohol abuse Father    COPD Father     Past medical history, surgical history, medications, allergies, family history and social history reviewed with patient today and changes made to appropriate areas of the chart.   Review of Systems  Eyes:  Negative for blurred vision and double vision.  Respiratory:  Negative for shortness of breath.   Cardiovascular:  Negative for chest pain, palpitations and leg swelling.  Musculoskeletal:  Positive for back pain.  Neurological:  Negative for dizziness and headaches.   All other ROS negative except what is listed above and in the HPI.      Objective:    BP 138/88 Comment: home blood pressure reading  Pulse 98   Temp 97.8 F (36.6 C) (Oral)   Ht 5' 7.52 (1.715 m)   Wt 198 lb (89.8 kg)   SpO2 95%   BMI 30.54 kg/m   Wt Readings from Last 3 Encounters:  11/06/24 198 lb (89.8 kg)  08/21/24 199 lb 9.6 oz (90.5 kg)  08/14/24 199 lb 12.8 oz (90.6 kg)    Physical Exam Vitals and nursing note reviewed.  Constitutional:      General: He is not in acute distress.    Appearance: Normal appearance. He is not ill-appearing, toxic-appearing or diaphoretic.  HENT:     Head: Normocephalic.     Right Ear: Tympanic membrane, ear canal and external ear normal.     Left Ear: Tympanic membrane, ear canal and external ear normal.     Nose: Nose normal. No congestion or rhinorrhea.     Mouth/Throat:     Mouth:  Mucous membranes are moist.  Eyes:     General:        Right eye: No discharge.        Left eye: No discharge.     Extraocular Movements: Extraocular movements intact.     Conjunctiva/sclera: Conjunctivae normal.     Pupils: Pupils are equal, round, and reactive to light.  Cardiovascular:     Rate and Rhythm: Normal rate and regular rhythm.     Heart sounds: No murmur heard. Pulmonary:     Effort: Pulmonary effort is normal. No respiratory distress.     Breath sounds: Normal breath sounds. No wheezing, rhonchi or rales.  Abdominal:     General: Abdomen is flat. Bowel sounds are normal. There is no distension.     Palpations: Abdomen is soft.     Tenderness: There is no abdominal tenderness. There is no guarding.  Musculoskeletal:     Cervical back: Normal range of motion and neck supple.  Skin:    General: Skin is warm and dry.     Capillary Refill: Capillary refill takes less than 2 seconds.  Neurological:     General: No focal deficit present.     Mental Status: He is alert and oriented to person, place, and time.     Cranial Nerves: No cranial nerve deficit.     Motor: No weakness.     Deep Tendon Reflexes: Reflexes normal.  Psychiatric:        Mood and Affect: Mood normal.        Behavior: Behavior normal.        Thought Content: Thought content normal.        Judgment: Judgment normal.     Results for orders placed  or performed in visit on 07/27/24  Medicare Annual Wellness Visits   Collection Time: 11/18/23 12:00 AM  Result Value Ref Range   HM Annual Wellness Visit See Report (in chart)       Assessment & Plan:   Problem List Items Addressed This Visit       Cardiovascular and Mediastinum   HTN (hypertension)   Chronic. Elevated at visit today.  Checks regularly at home.  Well controlled in 120-130/80s at home.  Continue with Amlodipine  10mg  daily.  Follow up in 3 months.  Call sooner if concerns arise.      Relevant Medications   amLODipine  (NORVASC ) 10  MG tablet   Carotid artery disease   Chronic.  Controlled.  Continue with current medication regimen.  Labs ordered today.  Return to clinic in 6 months for reevaluation.  Call sooner if concerns arise.        Relevant Medications   amLODipine  (NORVASC ) 10 MG tablet   clopidogrel  (PLAVIX ) 75 MG tablet   Other Relevant Orders   Lipid panel     Respiratory   ILD (interstitial lung disease) (HCC)   Managed with inhalers, no oxygen therapy needed. Under pulmonologist care. - Continue inhaler therapy of Breztri . - Refilled Albuterol  and Breztri .        Other   Chronic back pain   Pain managed with Norco, exacerbated by activity. Current regimen is 28 tablets every three months. Discussed risk or controlled substance/opioid precautions and risks for adverse events in older adults. He understands risks and wants to continue his current regiment. - Prescribe Norco 28 tablets every three months.  - Ensure prescription filled at [facility].      Relevant Medications   HYDROcodone -acetaminophen  (NORCO/VICODIN) 5-325 MG tablet   Other Visit Diagnoses       Encounter for Medicare annual wellness exam    -  Primary     Annual physical exam       Health maintenance reviewed during visit today.  Labs ordered.  Vaccines reviewed.   Relevant Orders   TSH   Lipid panel   CBC with Differential/Platelet   Comprehensive metabolic panel with GFR        Discussed aspirin prophylaxis for myocardial infarction prevention and decision was it was not indicated  LABORATORY TESTING:  Health maintenance labs ordered today as discussed above.    IMMUNIZATIONS:   - Tdap: Tetanus vaccination status reviewed: Declined. - Influenza: Up to date - Pneumovax: Up to date - Prevnar: Up to date - COVID: Refused - HPV: Not applicable - Shingrix vaccine: Refused  SCREENING: - Colonoscopy: Not applicable  Discussed with patient purpose of the colonoscopy is to detect colon cancer at curable  precancerous or early stages   - AAA Screening: Not applicable  -Hearing Test: Not applicable  -Spirometry: Not applicable   PATIENT COUNSELING:    Sexuality: Discussed sexually transmitted diseases, partner selection, use of condoms, avoidance of unintended pregnancy  and contraceptive alternatives.   Advised to avoid cigarette smoking.  I discussed with the patient that most people either abstain from alcohol or drink within safe limits (<=14/week and <=4 drinks/occasion for males, <=7/weeks and <= 3 drinks/occasion for females) and that the risk for alcohol disorders and other health effects rises proportionally with the number of drinks per week and how often a drinker exceeds daily limits.  Discussed cessation/primary prevention of drug use and availability of treatment for abuse.   Diet: Encouraged to adjust caloric intake to maintain  or achieve ideal body weight, to reduce intake of dietary saturated fat and total fat, to limit sodium intake by avoiding high sodium foods and not adding table salt, and to maintain adequate dietary potassium and calcium  preferably from fresh fruits, vegetables, and low-fat dairy products.    stressed the importance of regular exercise  Injury prevention: Discussed safety belts, safety helmets, smoke detector, smoking near bedding or upholstery.   Dental health: Discussed importance of regular tooth brushing, flossing, and dental visits.   Follow up plan: NEXT PREVENTATIVE PHYSICAL DUE IN 1 YEAR. Return in about 3 months (around 02/04/2025) for Medication Management.     [1]  Allergies Allergen Reactions   Contrast Media [Iodinated Contrast Media] Anaphylaxis   Shrimp [Shellfish Allergy] Anaphylaxis  [2]  Social History Tobacco Use  Smoking Status Former   Current packs/day: 0.00   Average packs/day: 1 pack/day for 56.0 years (56.0 ttl pk-yrs)   Types: Cigarettes   Start date: 10/07/1958   Quit date: 10/07/2014   Years since quitting:  10.0  Smokeless Tobacco Never   "

## 2024-11-06 NOTE — Assessment & Plan Note (Signed)
 Pain managed with Norco, exacerbated by activity. Current regimen is 28 tablets every three months. Discussed risk or controlled substance/opioid precautions and risks for adverse events in older adults. He understands risks and wants to continue his current regiment. - Prescribe Norco 28 tablets every three months.  - Ensure prescription filled at [facility].

## 2024-11-07 ENCOUNTER — Ambulatory Visit: Payer: Self-pay | Admitting: Nurse Practitioner

## 2024-11-07 LAB — COMPREHENSIVE METABOLIC PANEL WITH GFR
ALT: 16 IU/L (ref 0–44)
AST: 19 IU/L (ref 0–40)
Albumin: 4.5 g/dL (ref 3.8–4.8)
Alkaline Phosphatase: 95 IU/L (ref 47–123)
BUN/Creatinine Ratio: 15 (ref 10–24)
BUN: 15 mg/dL (ref 8–27)
Bilirubin Total: 0.4 mg/dL (ref 0.0–1.2)
CO2: 19 mmol/L — ABNORMAL LOW (ref 20–29)
Calcium: 9.7 mg/dL (ref 8.6–10.2)
Chloride: 103 mmol/L (ref 96–106)
Creatinine, Ser: 1 mg/dL (ref 0.76–1.27)
Globulin, Total: 2.9 g/dL (ref 1.5–4.5)
Glucose: 97 mg/dL (ref 70–99)
Potassium: 4.5 mmol/L (ref 3.5–5.2)
Sodium: 142 mmol/L (ref 134–144)
Total Protein: 7.4 g/dL (ref 6.0–8.5)
eGFR: 77 mL/min/1.73

## 2024-11-07 LAB — CBC WITH DIFFERENTIAL/PLATELET
Basophils Absolute: 0.1 x10E3/uL (ref 0.0–0.2)
Basos: 1 %
EOS (ABSOLUTE): 0.1 x10E3/uL (ref 0.0–0.4)
Eos: 1 %
Hematocrit: 48.3 % (ref 37.5–51.0)
Hemoglobin: 15.6 g/dL (ref 13.0–17.7)
Immature Grans (Abs): 0 x10E3/uL (ref 0.0–0.1)
Immature Granulocytes: 0 %
Lymphocytes Absolute: 2.5 x10E3/uL (ref 0.7–3.1)
Lymphs: 30 %
MCH: 31.1 pg (ref 26.6–33.0)
MCHC: 32.3 g/dL (ref 31.5–35.7)
MCV: 96 fL (ref 79–97)
Monocytes Absolute: 0.8 x10E3/uL (ref 0.1–0.9)
Monocytes: 10 %
Neutrophils Absolute: 4.9 x10E3/uL (ref 1.4–7.0)
Neutrophils: 58 %
Platelets: 273 x10E3/uL (ref 150–450)
RBC: 5.01 x10E6/uL (ref 4.14–5.80)
RDW: 13 % (ref 11.6–15.4)
WBC: 8.5 x10E3/uL (ref 3.4–10.8)

## 2024-11-07 LAB — LIPID PANEL
Chol/HDL Ratio: 3.2 ratio (ref 0.0–5.0)
Cholesterol, Total: 146 mg/dL (ref 100–199)
HDL: 45 mg/dL
LDL Chol Calc (NIH): 82 mg/dL (ref 0–99)
Triglycerides: 104 mg/dL (ref 0–149)
VLDL Cholesterol Cal: 19 mg/dL (ref 5–40)

## 2024-11-07 LAB — TSH: TSH: 3.15 u[IU]/mL (ref 0.450–4.500)

## 2025-02-06 ENCOUNTER — Ambulatory Visit: Admitting: Nurse Practitioner
